# Patient Record
Sex: Female | Born: 1975 | Race: Black or African American | Hispanic: No | Marital: Married | Smoking: Former smoker
Health system: Southern US, Community
[De-identification: ages and names within clinical notes are randomized; demographics above are authoritative.]

## PROBLEM LIST (undated history)

## (undated) DIAGNOSIS — R569 Unspecified convulsions: Secondary | ICD-10-CM

## (undated) HISTORY — PX: FOOT SURGERY: SHX648

---

## 2004-05-29 ENCOUNTER — Emergency Department (HOSPITAL_COMMUNITY): Admission: EM | Admit: 2004-05-29 | Discharge: 2004-05-29 | Payer: Self-pay | Admitting: Family Medicine

## 2004-06-03 ENCOUNTER — Emergency Department (HOSPITAL_COMMUNITY): Admission: EM | Admit: 2004-06-03 | Discharge: 2004-06-03 | Payer: Self-pay | Admitting: Internal Medicine

## 2008-01-06 ENCOUNTER — Ambulatory Visit (HOSPITAL_BASED_OUTPATIENT_CLINIC_OR_DEPARTMENT_OTHER): Admission: RE | Admit: 2008-01-06 | Discharge: 2008-01-06 | Payer: Self-pay | Admitting: Internal Medicine

## 2009-01-06 ENCOUNTER — Emergency Department (HOSPITAL_COMMUNITY): Admission: EM | Admit: 2009-01-06 | Discharge: 2009-01-06 | Payer: Self-pay | Admitting: Emergency Medicine

## 2011-02-27 NOTE — Procedures (Signed)
NAME:  Krista Crosby, SOUTHERS NO.:  [id number]   MEDICAL RECORD NO.:  [id number]          PATIENT TYPE:  OUT   LOCATION:  SLEEP CENTER                 FACILITY:  [facility]   PHYSICIAN:  Clinton D. Maple Hudson, MD, FCCP, FACPDATE OF BIRTH:  12/09/1975   DATE OF STUDY:  01/06/2008                            NOCTURNAL POLYSOMNOGRAM   REFERRING PHYSICIAN:   REFERRING PHYSICIAN:  Fleet Contras, M.D.   INDICATION FOR STUDY:  Hypersomnia with sleep apnea.   EPWORTH SLEEPINESS SCORE:  7/24.   BMI 29.  Weight 185 pounds.  Height 67 inches.  Neck 13.5 inches.   MEDICATIONS:  No home medications were listed.   SLEEP ARCHITECTURE:  Total sleep time 266 minutes with sleep efficiency  73.1%.  Stage I was 7.9%.  Stage II 82.3%.  Stage III absent.  REM 9.8%  of total sleep time.  Sleep latency 79.5 minutes.  REM latency 122  minutes.  Awake after sleep onset 17 minutes.  Arousal index 4.3.  No  bedtime medication taken.   RESPIRATORY DATA:  Apnea-hypopnea index (AHI) 2.3 per hour, which is  within normal limits.  Ten events were scored, including 4 obstructive  apneas and 6 hypopneas.  Most events occurred while not supine.  REM/AHI  11.5.   OXYGEN DATA:  Moderate snoring with oxygen desaturation to a nadir of  85%.  Mean oxygen saturation through the study was 94.3% on room air.   CARDIAC DATA:  Sinus rhythm with occasional PVC.   MOVEMENT-PARASOMNIA:  No significant movement disturbance.  Bathroom x1.   IMPRESSIONS-RECOMMENDATIONS:  1. Occasional respiratory event, within normal limits.  Apnea-hypopnea      index 2.3 per hour (normal range 0-5 per hour).  Events were more      common while not supine.  Moderate snoring with oxygen desaturation      to a nadir of 85%.  2. Sleep onset was delayed until midnight.  Sleep architecture was      otherwise unremarkable.  Suggest correlation with sleep habits at      home to insure appropriate sleep environment and schedule.      Clinton D. Maple Hudson, MD, [facility], FACP  Diplomate, Biomedical engineer of Sleep Medicine  Electronically Signed     CDY/MEDQ  D:  01/10/2008 10:48:33  T:  01/10/2008 12:19:14  Job:  [id number]

## 2015-02-07 ENCOUNTER — Emergency Department (HOSPITAL_COMMUNITY)
Admission: EM | Admit: 2015-02-07 | Discharge: 2015-02-07 | Disposition: A | Discharge status: Home / Self Care | Payer: Medicaid Other | Attending: Emergency Medicine | Admitting: Emergency Medicine

## 2015-02-07 ENCOUNTER — Encounter (HOSPITAL_COMMUNITY): Payer: Self-pay | Admitting: Emergency Medicine

## 2015-02-07 DIAGNOSIS — K0889 Other specified disorders of teeth and supporting structures: Secondary | ICD-10-CM

## 2015-02-07 DIAGNOSIS — K0381 Cracked tooth: Secondary | ICD-10-CM | POA: Diagnosis not present

## 2015-02-07 DIAGNOSIS — K088 Other specified disorders of teeth and supporting structures: Secondary | ICD-10-CM | POA: Diagnosis not present

## 2015-02-07 DIAGNOSIS — K029 Dental caries, unspecified: Secondary | ICD-10-CM | POA: Diagnosis not present

## 2015-02-07 MED ORDER — PENICILLIN V POTASSIUM 250 MG PO TABS: 250.0000 mg | ORAL_TABLET | Freq: Four times a day (QID) | ORAL | Status: AC

## 2015-02-07 NOTE — ED Notes (Signed)
Pain in r/upper mouth x 1 week

## 2015-02-07 NOTE — Discharge Instructions (Signed)

## 2015-02-07 NOTE — ED Provider Notes (Signed)
CSN: [id number]     Arrival date & time 02/07/15  1658 History  This chart was scribed for non-physician practitioner, Teressa LowerVrinda Aleesia Henney, NP, working with Pricilla LovelessScott Goldston, MD, by Lionel DecemberHatice Demirci, ED Scribe. This patient was seen in room WTR7/WTR7 and the patient's care was started at 5:07 PM.  (Consider location/radiation/quality/duration/timing/severity/associated sxs/prior Treatment) The history is provided by the patient. No language interpreter was used.    HPI Comments: Krista Crosby is a 39 y.o. female who presents to the Emergency Department complaining of right tooth pain onset a week ago.  Patient denies any fever/chills.  She does not use daily medication.  She has no other complaints today.    No past medical history on file. No past surgical history on file. No family history on file. History  Substance Use Topics  . Smoking status: Not on file  . Smokeless tobacco: Not on file  . Alcohol Use: Not on file   OB History    No data available     Review of Systems  HENT: Positive for dental problem.   All other systems reviewed and are negative.     Allergies  Review of patient's allergies indicates not on file.  Home Medications   Prior to Admission medications   Not on File   There were no vitals taken for this visit. Physical Exam  Constitutional: She is oriented to person, place, and time. She appears well-developed and well-nourished. No distress.  HENT:  Head: Normocephalic and atraumatic.  Right Ear: External ear normal.  Left Ear: External ear normal.  Mouth/Throat:    No swelling  Eyes: Conjunctivae and EOM are normal.  Neck: Neck supple.  Cardiovascular: Normal rate.   Pulmonary/Chest: Effort normal. No respiratory distress.  Musculoskeletal: Normal range of motion.  Neurological: She is alert and oriented to person, place, and time.  Skin: Skin is warm and dry.  Psychiatric: She has a normal mood and affect. Her behavior is normal.  Nursing  note and vitals reviewed.   ED Course  Procedures (including critical care time)  COORDINATION OF CARE: 5:09 PM Discussed treatment plan with patient at beside, the patient agrees with the plan and has no further questions at this time.   Labs Review Labs Reviewed - No data to display  Imaging Review No results found.   EKG Interpretation None      MDM   Final diagnoses:  Toothache   Pt given pcn for toothache and given dental referral  I personally performed the services described in this documentation, which was scribed in my presence. The recorded information has been reviewed and is accurate.    Teressa LowerVrinda Wylie Russon, NP 02/07/15 1727  Pricilla LovelessScott Goldston, MD 02/08/15 [phone]

## 2016-03-31 ENCOUNTER — Ambulatory Visit (HOSPITAL_COMMUNITY)
Admission: EM | Admit: 2016-03-31 | Discharge: 2016-03-31 | Disposition: A | Discharge status: Home / Self Care | Payer: Medicaid Other | Attending: Emergency Medicine | Admitting: Emergency Medicine

## 2016-03-31 ENCOUNTER — Encounter (HOSPITAL_COMMUNITY): Payer: Self-pay | Admitting: Emergency Medicine

## 2016-03-31 DIAGNOSIS — J069 Acute upper respiratory infection, unspecified: Secondary | ICD-10-CM | POA: Insufficient documentation

## 2016-03-31 DIAGNOSIS — F17200 Nicotine dependence, unspecified, uncomplicated: Secondary | ICD-10-CM | POA: Diagnosis not present

## 2016-03-31 DIAGNOSIS — J0140 Acute pansinusitis, unspecified: Secondary | ICD-10-CM

## 2016-03-31 LAB — POCT RAPID STREP A: Streptococcus, Group A Screen (Direct): NEGATIVE

## 2016-03-31 MED ORDER — AMOXICILLIN-POT CLAVULANATE 875-125 MG PO TABS: 1.0000 | ORAL_TABLET | Freq: Two times a day (BID) | ORAL | Status: DC

## 2016-03-31 MED ORDER — PSEUDOEPHEDRINE-GUAIFENESIN ER 120-1200 MG PO TB12: 1.0000 | ORAL_TABLET | Freq: Two times a day (BID) | ORAL | Status: DC | PRN

## 2016-03-31 MED ORDER — GUAIFENESIN-CODEINE 100-10 MG/5ML PO SYRP: 10.0000 mL | ORAL_SOLUTION | Freq: Four times a day (QID) | ORAL | Status: DC | PRN

## 2016-03-31 MED ORDER — IPRATROPIUM BROMIDE 0.06 % NA SOLN: 2.0000 | Freq: Four times a day (QID) | NASAL | Status: DC

## 2016-03-31 MED ORDER — AEROCHAMBER PLUS MISC: Status: DC

## 2016-03-31 MED ORDER — ALBUTEROL SULFATE HFA 108 (90 BASE) MCG/ACT IN AERS: 1.0000 | INHALATION_SPRAY | Freq: Four times a day (QID) | RESPIRATORY_TRACT | Status: DC | PRN

## 2016-03-31 NOTE — Discharge Instructions (Signed)
Take the medication as written. You may take 800 mg of motrin with 1 gram of tylenol up to 3 times a day as needed for pain. This is an effective combination for pain.  Most sinus infections are viral and do not need antibiotics unless you have a high fever, have had this for 10 days, or you get better and then get sick again. Use a neti pot or the NeilMed sinus rinse as often as you want to to reduce nasal congestion. Follow the directions on the box.  ° °Go to www.goodrx.com to look up your medications. This will give you a list of where you can find your prescriptions at the most affordable prices.   °

## 2016-03-31 NOTE — ED Notes (Signed)
Here for cold sx onset yest associated w/runny nose, BA, sneezing, chills, prod cough, ST, fevers, HA... A&O x4... No acute distress.

## 2016-03-31 NOTE — ED Provider Notes (Signed)
HPI  SUBJECTIVE:  Krista Crosby is a 40 y.o. female who presents with nasalGovernor Specking congestion, rhinorrhea, postnasal drip, ear pain, sinus pain/pressure, sore throat for the past 2 days. She reports cough productive of whitish sputum and states that she cannot sleep at night secondary to coughing. She has tried TheraFlu with improvement in her symptoms. There are no aggravating factors. She has not tried anything else for this She denies voice changes, drooling, trismus, wheezing, chest pain, shortness of breath. She reports abdominal soreness secondary to the cough, but no other abdominal pain. She is tolerating by mouth. No fevers. He reports some sneezing, but No itchy, watery eyes, known sick contacts. No neck stiffness, rash, known tick bite. No antipyretics in the past 6-8 hours. Past medical history of asthma, remote history of seizures. No history of seasonal allergies, diabetes, hypertension.  LMP: "Last month" denies possibility of being pregnant, states that we do not need to check. [facility]: [facility].   History reviewed. No pertinent past medical history.  History reviewed. No pertinent past surgical history.  No family history on file.  Social History  Substance Use Topics  . Smoking status: Current Some Day Smoker  . Smokeless tobacco: None  . Alcohol Use: Yes     Comment: occ    No current facility-administered medications for this encounter.  Current outpatient prescriptions:  .  albuterol (PROVENTIL HFA;VENTOLIN HFA) 108 (90 Base) MCG/ACT inhaler, Inhale 1-2 puffs into the lungs every 6 (six) hours as needed for wheezing or shortness of breath., Disp: 1 Inhaler, Rfl: 0 .  amoxicillin-clavulanate (AUGMENTIN) 875-125 MG tablet, Take 1 tablet by mouth 2 (two) times daily. X 7 days, Disp: 14 tablet, Rfl: 0 .  guaiFENesin-codeine (CHERATUSSIN AC) 100-10 MG/5ML syrup, Take 10 mLs by mouth 4 (four) times daily as needed for cough or congestion., Disp: 120 mL, Rfl: 0 .   ipratropium (ATROVENT) 0.06 % nasal spray, Place 2 sprays into both nostrils 4 (four) times daily. 3-4 times/ day, Disp: 15 mL, Rfl: 0 .  Pseudoephedrine-Guaifenesin (MUCINEX D) [phone] MG TB12, Take 1 tablet by mouth 2 (two) times daily as needed (congestion)., Disp: 20 each, Rfl: 0 .  Spacer/Aero-Holding Chambers (AEROCHAMBER PLUS) inhaler, Use as instructed, Disp: 1 each, Rfl: 2  No Known Allergies   ROS  As noted in HPI.   Physical Exam  BP 122/70 mmHg  Pulse 94  Temp(Src) 97.9 F (36.6 C) (Oral)  SpO2 100%  Constitutional: Well developed, well nourished, no acute distress Eyes:  EOMI, conjunctiva normal bilaterally HENT: Normocephalic, atraumatic,mucus membranes moistTMs normal bilaterally. Positive purulent nasal congestion, swollen, erythematous turbinates. Positive frontal and maxillary sinus tenderness. Post postnasal drip, oropharynx otherwise normal. Neck: No cervical lymphadenopathy, no meningismus Respiratory: Normal inspiratory effort, good air movement, lungs clear bilaterally Cardiovascular: Normal rate regular rhythm, no murmurs rubs gallops GI: nondistended skin: No rash, skin intact Musculoskeletal: no deformities Neurologic: Alert & oriented x 3, no focal neuro deficits Psychiatric: Speech and behavior appropriate   ED Course   Medications - No data to display  Orders Placed This Encounter  Procedures  . Culture, group A strep    Standing Status: Standing     Number of Occurrences: 1     Standing Expiration Date:   . POCT rapid strep A [facility]    Standing Status: Standing     Number of Occurrences: 1     Standing Expiration Date:     No results found for this or  any previous visit (from the past 24 hour(s)). No results found.  ED Clinical Impression  URI (upper respiratory infection)  Acute pansinusitis, recurrence not specified   ED Assessment/Plan  Strep negative. Presentation consistent with a sinusitis, most likely viral.  We'll send Home with  Mucinex D, Atrovent nasal spray, saline nasal irrigation and a wait-and-see prescription of Augmentin. Advised her to not fill this at this time. Gave her clear directions on when to fill the antibiotic prescription- if double sickening, fevers above 102 or symptomatic for more than 10 days. We'll also sent home with albuterol with spacer and Cheratussin and she has a history of asthma and persistent cough. Follow up with primary care physician as needed.  Discussed MDM, plan and followup with patient.  Patient agrees with plan.   *This clinic note was created using Dragon dictation software. Therefore, there may be occasional mistakes despite careful proofreading.  ?   Domenick Gong, MD 04/02/16 1029

## 2016-04-04 LAB — CULTURE, GROUP A STREP (THRC)

## 2016-04-09 ENCOUNTER — Telehealth (HOSPITAL_COMMUNITY): Payer: Self-pay | Admitting: Emergency Medicine

## 2016-04-09 NOTE — ED Notes (Signed)
LM w/Rhonda (friend)... Friend reports pt is feeling better... Adv her to have pt to return call if she wants results.  Need to give lab results from recent visit on 6/17 Also let pt know labs can be obtained from MyChart  Per Dr. Piedad ClimesHonig,   Notes Recorded by Charm RingsErin J Honig, MD on 04/04/2016 at 6:20 PM Throat culture is negative. Wait and see Augmentin prescription should be [facility]. Follow up as needed.

## 2016-08-29 ENCOUNTER — Encounter (HOSPITAL_BASED_OUTPATIENT_CLINIC_OR_DEPARTMENT_OTHER): Payer: Self-pay | Admitting: *Deleted

## 2016-08-29 ENCOUNTER — Emergency Department (HOSPITAL_BASED_OUTPATIENT_CLINIC_OR_DEPARTMENT_OTHER)
Admission: EM | Admit: 2016-08-29 | Discharge: 2016-08-29 | Disposition: A | Discharge status: Home / Self Care | Payer: Medicaid Other | Attending: Physician Assistant | Admitting: Physician Assistant

## 2016-08-29 DIAGNOSIS — F17200 Nicotine dependence, unspecified, uncomplicated: Secondary | ICD-10-CM | POA: Diagnosis not present

## 2016-08-29 DIAGNOSIS — Z79899 Other long term (current) drug therapy: Secondary | ICD-10-CM | POA: Insufficient documentation

## 2016-08-29 DIAGNOSIS — B379 Candidiasis, unspecified: Secondary | ICD-10-CM | POA: Diagnosis not present

## 2016-08-29 DIAGNOSIS — R102 Pelvic and perineal pain: Secondary | ICD-10-CM | POA: Diagnosis present

## 2016-08-29 LAB — URINALYSIS, ROUTINE W REFLEX MICROSCOPIC
BILIRUBIN URINE: NEGATIVE
Glucose, UA: NEGATIVE mg/dL
Hgb urine dipstick: NEGATIVE
Ketones, ur: NEGATIVE mg/dL
Nitrite: NEGATIVE
PH: 6 (ref 5.0–8.0)
Protein, ur: NEGATIVE mg/dL
SPECIFIC GRAVITY, URINE: 1.02 (ref 1.005–1.030)

## 2016-08-29 LAB — URINE MICROSCOPIC-ADD ON

## 2016-08-29 LAB — WET PREP, GENITAL
Clue Cells Wet Prep HPF POC: NONE SEEN
Sperm: NONE SEEN
TRICH WET PREP: NONE SEEN

## 2016-08-29 LAB — PREGNANCY, URINE: Preg Test, Ur: NEGATIVE

## 2016-08-29 MED ORDER — FLUCONAZOLE 50 MG PO TABS
150.0000 mg | ORAL_TABLET | Freq: Once | ORAL | Status: AC
  Administered 2016-08-29: 150 mg via ORAL
  Filled 2016-08-29: qty 1

## 2016-08-29 MED ORDER — NYSTATIN 100000 UNIT/GM EX CREA: TOPICAL_CREAM | CUTANEOUS | 0 refills | Status: DC

## 2016-08-29 NOTE — ED Notes (Signed)
ED Provider at bedside. 

## 2016-08-29 NOTE — ED Triage Notes (Addendum)
Pt c/o painful freq urination x 1 week  Was seen at [facility]  last week for same taking pyridium w/o relief

## 2016-08-29 NOTE — Discharge Instructions (Signed)
You were een today and he had a vaginal infection with yeast. We've given you an oral antibiotic. Additionally given yeast cream today to help with yourer symptoms. Please return if you have any concerns.

## 2016-08-29 NOTE — ED Provider Notes (Signed)
[facility] Provider Note   CSN: [id number] Arrival date & time: 08/29/16  1744  By signing my name below, I, Linna Darner, attest that this documentation has been prepared under the direction and in the presence of physician practitioner, Yaire Kreher Randall An, MD. Electronically Signed: Linna Darner, Scribe. 08/29/2016. 6:34 PM.  History   Chief Complaint Chief Complaint  Patient presents with  . Vaginal Pain    The history is provided by the patient. No language interpreter was used.     HPI Comments: Krista Crosby is a 40 y.o. female who presents to the Emergency Department complaining of constant vaginal pain and irritation for the last two weeks. Pt reports her pain is worse with urination or with palpation to her vagina. She reports associated dysuria as well. Pt was seen at [facility] ER on 11[facility]11 for the same; she was prescribed Pyridium and Vagisil with no relief of her symptoms. She notes she did not have a pelvic exam performed at [facility]. She reports she has recently had unprotected sexual intercourse. She denies nausea, vomiting, diarrhea, fever, chills, or any other associated symptoms.  History reviewed. No pertinent past medical history.  There are no active problems to display for this patient.   History reviewed. No pertinent surgical history.  OB History    No data available       Home Medications    Prior to Admission medications   Medication Sig Start Date End Date Taking? Authorizing Provider  phenazopyridine (PYRIDIUM) 100 MG tablet Take 100 mg by mouth 3 (three) times daily as needed for pain.   Yes Historical Provider, MD  albuterol (PROVENTIL HFA;VENTOLIN HFA) 108 (90 Base) MCG/ACT inhaler Inhale 1-2 puffs into the lungs every 6 (six) hours as needed for wheezing or shortness of breath. 03/31/16   Domenick Gong, MD  amoxicillin-clavulanate (AUGMENTIN) 875-125 MG tablet Take 1 tablet by mouth 2 (two)  times daily. X 7 days 03/31/16   Domenick Gong, MD  guaiFENesin-codeine [facility]) 100-10 MG/5ML syrup Take 10 mLs by mouth 4 (four) times daily as needed for cough or congestion. 03/31/16   Domenick Gong, MD  ipratropium (ATROVENT) 0.06 % nasal spray Place 2 sprays into both nostrils 4 (four) times daily. 3-4 times/ day 03/31/16   Domenick Gong, MD  nystatin cream (MYCOSTATIN) Apply to affected area 2 times daily 08/29/16   Deontrae Drinkard Lyn Shalini Mair, MD  Pseudoephedrine-Guaifenesin (MUCINEX D) [phone] MG TB12 Take 1 tablet by mouth 2 (two) times daily as needed (congestion). 03/31/16   Domenick Gong, MD  Spacer/Aero-Holding Chambers (AEROCHAMBER PLUS) inhaler Use as instructed 03/31/16   Domenick Gong, MD    Family History History reviewed. No pertinent family history.  Social History Social History  Substance Use Topics  . Smoking status: Current Some Day Smoker  . Smokeless tobacco: Never Used  . Alcohol use Yes     Comment: occ     Allergies   Patient has no known allergies.   Review of Systems Review of Systems  Constitutional: Negative for chills and fever.  Gastrointestinal: Negative for diarrhea, nausea and vomiting.  Genitourinary: Positive for dysuria and vaginal pain.  All other systems reviewed and are negative.   Physical Exam Updated Vital Signs BP 119/91 (BP Location: Right Arm)   Pulse 89   Temp 98.2 F (36.8 C)   Resp 16   Ht 5\' 7"  (1.702 m)   Wt 200 lb (90.7 kg)   SpO2 100%  BMI 31.32 kg/m   Physical Exam  Constitutional: She is oriented to person, place, and time. She appears well-developed and well-nourished. No distress.  HENT:  Head: Normocephalic and atraumatic.  Eyes: Conjunctivae and EOM are normal.  Neck: Neck supple. No tracheal deviation present.  Cardiovascular: Normal rate.   Pulmonary/Chest: Effort normal. No respiratory distress.  Genitourinary:  Genitourinary Comments: Vaginal exam: scant whitish-yellow vaginal  discharge, no CMT.  Musculoskeletal: Normal range of motion.  Neurological: She is alert and oriented to person, place, and time.  Skin: Skin is warm and dry.  Psychiatric: She has a normal mood and affect. Her behavior is normal.  Nursing note and vitals reviewed.    ED Treatments / Results  Labs (all labs ordered are listed, but only abnormal results are displayed) Labs Reviewed  WET PREP, GENITAL - Abnormal; Notable for the following:       Result Value   Yeast Wet Prep HPF POC PRESENT (*)    WBC, Wet Prep HPF POC MANY (*)    All other components within normal limits  URINALYSIS, ROUTINE W REFLEX MICROSCOPIC (NOT AT [facility]) - Abnormal; Notable for the following:    APPearance CLOUDY (*)    Leukocytes, UA LARGE (*)    All other components within normal limits  URINE MICROSCOPIC-ADD ON - Abnormal; Notable for the following:    Squamous Epithelial / LPF 6-30 (*)    Bacteria, UA FEW (*)    All other components within normal limits  PREGNANCY, URINE  GC/CHLAMYDIA PROBE AMP ([place]) NOT AT [facility]    EKG  EKG Interpretation None       Radiology No results found.  Procedures Procedures (including critical care time)  DIAGNOSTIC STUDIES: Oxygen Saturation is 98% on RA, normal by my interpretation.    COORDINATION OF CARE: 6:41 PM Discussed treatment plan with pt at bedside and pt agreed to plan.  Medications Ordered in ED Medications  fluconazole (DIFLUCAN) tablet 150 mg (150 mg Oral Given 08/29/16 1942)     Initial Impression / Assessment and Plan / ED Course  I have reviewed the triage vital signs and the nursing notes.  Pertinent labs & imaging results that were available during my care of the patient were reviewed by me and considered in my medical decision making (see chart for details).  Clinical Course    Patient is a 40 year old female presenting with vaginal pain. Patient reports that [facility] and did not have a vaginal  exam but was given Pyridium as well as some kind of vaginal cream. Patient does not have a urinary tract infection at that time. Patient reports she still has irritation to her vagina. She reports that she recently switched Dove soaps. Patient reports unprotected sex but has not had any vesicles or lesions.  Gen. exam shows no lesions, normal vaginal mucosa maybe with slight irritation.  +yeast, treatwith diflucan and cream.   Patient is comfortable, ambulatory, and taking PO at time of discharge.  Patient expressed understanding about return precautions.     I personally performed the services described in this documentation, which was scribed in my presence. The recorded information has been reviewed and is accurate.     Final Clinical Impressions(s) / ED Diagnoses   Final diagnoses:  Yeast infection    New Prescriptions Discharge Medication List as of 08/29/2016  7:36 PM    START taking these medications   Details  nystatin cream (MYCOSTATIN) Apply to affected area  2 times daily, Print         Charika Mikelson Randall AnLyn Nelma Phagan, MD 08/29/16 2003

## 2016-08-30 LAB — GC/CHLAMYDIA PROBE AMP (~~LOC~~) NOT AT ARMC
CHLAMYDIA, DNA PROBE: NEGATIVE
NEISSERIA GONORRHEA: NEGATIVE

## 2017-01-15 ENCOUNTER — Emergency Department (HOSPITAL_BASED_OUTPATIENT_CLINIC_OR_DEPARTMENT_OTHER)
Admission: EM | Admit: 2017-01-15 | Discharge: 2017-01-15 | Disposition: A | Discharge status: Home / Self Care | Payer: Medicaid Other | Attending: Emergency Medicine | Admitting: Emergency Medicine

## 2017-01-15 ENCOUNTER — Encounter (HOSPITAL_BASED_OUTPATIENT_CLINIC_OR_DEPARTMENT_OTHER): Payer: Self-pay | Admitting: Emergency Medicine

## 2017-01-15 ENCOUNTER — Emergency Department (HOSPITAL_BASED_OUTPATIENT_CLINIC_OR_DEPARTMENT_OTHER): Payer: Medicaid Other

## 2017-01-15 DIAGNOSIS — Z79899 Other long term (current) drug therapy: Secondary | ICD-10-CM | POA: Insufficient documentation

## 2017-01-15 DIAGNOSIS — R109 Unspecified abdominal pain: Secondary | ICD-10-CM

## 2017-01-15 DIAGNOSIS — R197 Diarrhea, unspecified: Secondary | ICD-10-CM | POA: Insufficient documentation

## 2017-01-15 DIAGNOSIS — R1032 Left lower quadrant pain: Secondary | ICD-10-CM | POA: Diagnosis not present

## 2017-01-15 DIAGNOSIS — F17200 Nicotine dependence, unspecified, uncomplicated: Secondary | ICD-10-CM | POA: Insufficient documentation

## 2017-01-15 HISTORY — DX: Unspecified convulsions: R56.9

## 2017-01-15 LAB — URINALYSIS, ROUTINE W REFLEX MICROSCOPIC
Bilirubin Urine: NEGATIVE
GLUCOSE, UA: NEGATIVE mg/dL
Ketones, ur: NEGATIVE mg/dL
Leukocytes, UA: NEGATIVE
Nitrite: NEGATIVE
PH: 6 (ref 5.0–8.0)
PROTEIN: NEGATIVE mg/dL
Specific Gravity, Urine: 1.003 — ABNORMAL LOW (ref 1.005–1.030)

## 2017-01-15 LAB — URINALYSIS, MICROSCOPIC (REFLEX)

## 2017-01-15 LAB — PREGNANCY, URINE: Preg Test, Ur: NEGATIVE

## 2017-01-15 LAB — CBG MONITORING, ED: GLUCOSE-CAPILLARY: 78 mg/dL (ref 65–99)

## 2017-01-15 MED ORDER — KETOROLAC TROMETHAMINE 30 MG/ML IJ SOLN
30.0000 mg | Freq: Once | INTRAMUSCULAR | Status: AC
  Administered 2017-01-15: 30 mg via INTRAVENOUS
  Filled 2017-01-15: qty 1

## 2017-01-15 MED ORDER — MORPHINE SULFATE (PF) 4 MG/ML IV SOLN
4.0000 mg | Freq: Once | INTRAVENOUS | Status: AC
  Administered 2017-01-15: 4 mg via INTRAVENOUS
  Filled 2017-01-15: qty 1

## 2017-01-15 NOTE — ED Provider Notes (Signed)
[facility] Provider Note   CSN: [id number] Arrival date & time: 01/15/17  1045     History   Chief Complaint Chief Complaint  Patient presents with  . Flank Pain    HPI Krista Crosby is a 41 y.o. female.  HPI Patient is a 41 year old female presents emergency department with severe left flank pain and left lower abdominal pain intermittently over the past 3 weeks with urinary frequency.  No history of kidney stones.  Denies nausea vomiting.  Reports some occasional diarrhea.  No fevers or chills.  No other significant complaints.   Past Medical History:  Diagnosis Date  . Seizures (HCC)     There are no active problems to display for this patient.   Past Surgical History:  Procedure Laterality Date  . FOOT SURGERY      OB History    No data available       Home Medications    Prior to Admission medications   Medication Sig Start Date End Date Taking? Authorizing Provider  albuterol (PROVENTIL HFA;VENTOLIN HFA) 108 (90 Base) MCG/ACT inhaler Inhale 1-2 puffs into the lungs every 6 (six) hours as needed for wheezing or shortness of breath. 03/31/16   Domenick Gong, MD  amoxicillin-clavulanate (AUGMENTIN) 875-125 MG tablet Take 1 tablet by mouth 2 (two) times daily. X 7 days 03/31/16   Domenick Gong, MD  guaiFENesin-codeine [facility]) 100-10 MG/5ML syrup Take 10 mLs by mouth 4 (four) times daily as needed for cough or congestion. 03/31/16   Domenick Gong, MD  ipratropium (ATROVENT) 0.06 % nasal spray Place 2 sprays into both nostrils 4 (four) times daily. 3-4 times/ day 03/31/16   Domenick Gong, MD  nystatin cream (MYCOSTATIN) Apply to affected area 2 times daily 08/29/16   Courteney Lyn Mackuen, MD  phenazopyridine (PYRIDIUM) 100 MG tablet Take 100 mg by mouth 3 (three) times daily as needed for pain.    Historical Provider, MD  Pseudoephedrine-Guaifenesin (MUCINEX D) [phone] MG TB12 Take 1 tablet by mouth 2 (two) times daily as needed  (congestion). 03/31/16   Domenick Gong, MD  Spacer/Aero-Holding Chambers (AEROCHAMBER PLUS) inhaler Use as instructed 03/31/16   Domenick Gong, MD    Family History History reviewed. No pertinent family history.  Social History Social History  Substance Use Topics  . Smoking status: Current Some Day Smoker  . Smokeless tobacco: Never Used  . Alcohol use Yes     Comment: occ     Allergies   Patient has no known allergies.   Review of Systems Review of Systems  All other systems reviewed and are negative.    Physical Exam Updated Vital Signs BP 101/69 (BP Location: Right Arm)   Pulse 60   Temp 98.4 F (36.9 C) (Oral)   Resp 16   Ht  (1.651 m)   Wt 200 lb (90.7 kg)   SpO2 99%   BMI 33.28 kg/m   Physical Exam  Constitutional: She is oriented to person, place, and time. She appears well-developed and well-nourished. No distress.  HENT:  Head: Normocephalic and atraumatic.  Eyes: EOM are normal.  Neck: Normal range of motion.  Cardiovascular: Normal rate, regular rhythm and normal heart sounds.   Pulmonary/Chest: Effort normal and breath sounds normal.  Abdominal: Soft. She exhibits no distension. There is no tenderness.  Musculoskeletal: Normal range of motion.  Neurological: She is alert and oriented to person, place, and time.  Skin: Skin is warm and dry.  Psychiatric: She has a  normal mood and affect. Judgment normal.  Nursing note and vitals reviewed.    ED Treatments / Results  Labs (all labs ordered are listed, but only abnormal results are displayed) Labs Reviewed  URINALYSIS, ROUTINE W REFLEX MICROSCOPIC - Abnormal; Notable for the following:       Result Value   Specific Gravity, Urine 1.003 (*)    Hgb urine dipstick LARGE (*)    All other components within normal limits  URINALYSIS, MICROSCOPIC (REFLEX) - Abnormal; Notable for the following:    Bacteria, UA RARE (*)    Squamous Epithelial / LPF 0-5 (*)    All other components within  normal limits  PREGNANCY, URINE  CBG MONITORING, ED    EKG  EKG Interpretation None       Radiology Ct Renal Stone Study  Result Date: 01/15/2017 CLINICAL DATA:  Left flank pain.  Hematuria. EXAM: CT ABDOMEN AND PELVIS WITHOUT CONTRAST TECHNIQUE: Multidetector CT imaging of the abdomen and pelvis was performed following the standard protocol without IV contrast. COMPARISON:  08/17/2016 unenhanced CT abdomen/pelvis. FINDINGS: Lower chest: Mild paraseptal emphysema at the lung bases. No acute abnormality. Hepatobiliary: Normal liver with no liver mass. Normal gallbladder with no radiopaque cholelithiasis. No biliary ductal dilatation. Pancreas: Normal, with no mass or duct dilation. Spleen: Normal size. No mass. Adrenals/Urinary Tract: Normal adrenals. No renal stones. No hydronephrosis. No contour deforming renal mass. Normal caliber ureters, with no ureteral stones. Normal bladder, with no bladder stones or bladder wall thickening. Stomach/Bowel: Grossly normal stomach. Normal caliber small bowel with no small bowel wall thickening. Normal appendix . Normal large bowel with no diverticulosis, large bowel wall thickening or pericolonic fat stranding. Vascular/Lymphatic: Normal caliber abdominal aorta. No pathologically enlarged lymph nodes in the abdomen or pelvis. Reproductive: Grossly normal uterus.  No adnexal mass. Other: No pneumoperitoneum, ascites or focal fluid collection. Musculoskeletal: No aggressive appearing focal osseous lesions. Mild thoracolumbar spondylosis. IMPRESSION: 1. No acute abnormality. No urolithiasis. No evidence of urinary tract obstruction . No evidence of bowel obstruction or acute bowel inflammation. 2. Mild paraseptal emphysema at the lung bases. Electronically Signed   By: Delbert Phenix M.D.   On: 01/15/2017 13:38    Procedures Procedures (including critical care time)  Medications Ordered in ED Medications  morphine 4 MG/ML injection 4 mg (4 mg Intravenous Given  01/15/17 1243)  ketorolac (TORADOL) 30 MG/ML injection 30 mg (30 mg Intravenous Given 01/15/17 1244)     Initial Impression / Assessment and Plan / ED Course  I have reviewed the triage vital signs and the nursing notes.  Pertinent labs & imaging results that were available during my care of the patient were reviewed by me and considered in my medical decision making (see chart for details).     CT scan without acute pathology.  No ureteral stone.  Reports she feels much better at this time.  Discharge home in good condition.  Primary care follow-up.  Patient understands return to the ER for new or worsening symptoms.  Final Clinical Impressions(s) / ED Diagnoses   Final diagnoses:  Left flank pain    New Prescriptions Discharge Medication List as of 01/15/2017  2:35 PM       Azalia Bilis, MD 01/15/17 1529

## 2017-01-15 NOTE — ED Triage Notes (Addendum)
Pt reports left flank and left lower abd pain x3 weeks, dysuria, frequency, and odor to urine.  Occasional diarrhea, denies n/v. Pt also having clear vaginal discharge with odor.  Pt alert and oriented.

## 2017-05-19 ENCOUNTER — Encounter (HOSPITAL_BASED_OUTPATIENT_CLINIC_OR_DEPARTMENT_OTHER): Payer: Self-pay

## 2017-05-19 ENCOUNTER — Emergency Department (HOSPITAL_BASED_OUTPATIENT_CLINIC_OR_DEPARTMENT_OTHER)
Admission: EM | Admit: 2017-05-19 | Discharge: 2017-05-19 | Disposition: A | Discharge status: Home / Self Care | Payer: Medicaid Other | Attending: Emergency Medicine | Admitting: Emergency Medicine

## 2017-05-19 DIAGNOSIS — Z87891 Personal history of nicotine dependence: Secondary | ICD-10-CM | POA: Diagnosis not present

## 2017-05-19 DIAGNOSIS — Z79899 Other long term (current) drug therapy: Secondary | ICD-10-CM | POA: Diagnosis not present

## 2017-05-19 DIAGNOSIS — K625 Hemorrhage of anus and rectum: Secondary | ICD-10-CM | POA: Diagnosis present

## 2017-05-19 DIAGNOSIS — M25512 Pain in left shoulder: Secondary | ICD-10-CM | POA: Diagnosis not present

## 2017-05-19 LAB — CBC WITH DIFFERENTIAL/PLATELET
BASOS PCT: 0 %
Basophils Absolute: 0 10*3/uL (ref 0.0–0.1)
EOS PCT: 3 %
Eosinophils Absolute: 0.2 10*3/uL (ref 0.0–0.7)
HEMATOCRIT: 36.7 % (ref 36.0–46.0)
Hemoglobin: 12.3 g/dL (ref 12.0–15.0)
Lymphocytes Relative: 46 %
Lymphs Abs: 2.5 10*3/uL (ref 0.7–4.0)
MCH: 27.6 pg (ref 26.0–34.0)
MCHC: 33.5 g/dL (ref 30.0–36.0)
MCV: 82.5 fL (ref 78.0–100.0)
MONOS PCT: 13 %
Monocytes Absolute: 0.7 10*3/uL (ref 0.1–1.0)
NEUTROS ABS: 2.1 10*3/uL (ref 1.7–7.7)
Neutrophils Relative %: 38 %
PLATELETS: 291 10*3/uL (ref 150–400)
RBC: 4.45 MIL/uL (ref 3.87–5.11)
RDW: 13.2 % (ref 11.5–15.5)
WBC: 5.5 10*3/uL (ref 4.0–10.5)

## 2017-05-19 LAB — BASIC METABOLIC PANEL
ANION GAP: 7 (ref 5–15)
BUN: 12 mg/dL (ref 6–20)
CALCIUM: 9 mg/dL (ref 8.9–10.3)
CO2: 24 mmol/L (ref 22–32)
Chloride: 109 mmol/L (ref 101–111)
Creatinine, Ser: 0.84 mg/dL (ref 0.44–1.00)
GFR calc Af Amer: >60 mL/min (ref >60)
GFR calc non Af Amer: >60 mL/min (ref >60)
Glucose, Bld: 88 mg/dL (ref 65–99)
POTASSIUM: 3.4 mmol/L — AB (ref 3.5–5.1)
SODIUM: 140 mmol/L (ref 135–145)

## 2017-05-19 LAB — OCCULT BLOOD X 1 CARD TO LAB, STOOL: FECAL OCCULT BLD: POSITIVE — AB

## 2017-05-19 NOTE — ED Provider Notes (Signed)
[facility]   CSN: [id number] Arrival date & time: 05/19/17  [id number]     History   Chief Complaint Chief Complaint  Patient presents with  . Rectal Bleeding  . Shoulder Pain    Left    HPI Governor SpeckingLatanyia Y Crosby is a 41 y.o. female.  Patient is a 41 year old female who presents with complaints of rectal bleeding. She states that 2 weeks ago she began with passing bright red blood with her bowel movements. She reports normal stool that is coated in blood. She also reports having blood dripping into the toilet. She denies any abdominal or rectal pain. She denies any fevers or chills. She denies any history of hemorrhoids or rectal fissures. She was seen by her primary doctor who referred her to a specialist, however she canceled that appointment because the bleeding stopped. Resumed this morning and she presents for evaluation of this. She denies any lightheadedness, dizziness, chest pain, or shortness of breath.  She is also complaining of left shoulder pain. She feels that this began at work lifting a heavy object. She has pain to the front of the left shoulder that is worse when she moves her arm. She denies any numbness or tingling. She denies any weakness.   The history is provided by the patient.  Rectal Bleeding  Quality:  Bright red Amount:  Moderate Timing:  Intermittent Chronicity:  New Context: defecation   Context: not anal fissures, not anal penetration, not constipation and not diarrhea   Relieved by:  Nothing Worsened by:  Nothing Ineffective treatments:  None tried Associated symptoms: no abdominal pain and no fever   Risk factors: no anticoagulant use   Shoulder Pain      Past Medical History:  Diagnosis Date  . Seizures (HCC)     There are no active problems to display for this patient.   Past Surgical History:  Procedure Laterality Date  . FOOT SURGERY      OB History    No data available       Home Medications    Prior  to Admission medications   Medication Sig Start Date End Date Taking? Authorizing Provider  albuterol (PROVENTIL HFA;VENTOLIN HFA) 108 (90 Base) MCG/ACT inhaler Inhale 1-2 puffs into the lungs every 6 (six) hours as needed for wheezing or shortness of breath. 03/31/16   Domenick GongMortenson, Ashley, MD  amoxicillin-clavulanate (AUGMENTIN) 875-125 MG tablet Take 1 tablet by mouth 2 (two) times daily. X 7 days 03/31/16   Domenick GongMortenson, Ashley, MD  guaiFENesin-codeine [facility](CHERATUSSIN AC) 100-10 MG/5ML syrup Take 10 mLs by mouth 4 (four) times daily as needed for cough or congestion. 03/31/16   Domenick GongMortenson, Ashley, MD  ipratropium (ATROVENT) 0.06 % nasal spray Place 2 sprays into both nostrils 4 (four) times daily. 3-4 times/ day 03/31/16   Domenick GongMortenson, Ashley, MD  nystatin cream (MYCOSTATIN) Apply to affected area 2 times daily 08/29/16   Mackuen, Courteney Lyn, MD  phenazopyridine (PYRIDIUM) 100 MG tablet Take 100 mg by mouth 3 (three) times daily as needed for pain.    [provider]  Pseudoephedrine-Guaifenesin (MUCINEX D) [phone] MG TB12 Take 1 tablet by mouth 2 (two) times daily as needed (congestion). 03/31/16   Domenick GongMortenson, Ashley, MD  Spacer/Aero-Holding Chambers (AEROCHAMBER PLUS) inhaler Use as instructed 03/31/16   Domenick GongMortenson, Ashley, MD    Family History No family history on file.  Social History Social History  Substance Use Topics  . Smoking status: Former Games developermoker  . Smokeless tobacco: Never Used  .  Alcohol use No     Comment: occ     Allergies   Patient has no known allergies.   Review of Systems Review of Systems  Constitutional: Negative for fever.  Gastrointestinal: Positive for hematochezia. Negative for abdominal pain.  All other systems reviewed and are negative.    Physical Exam Updated Vital Signs BP 124/86 (BP Location: Left Arm)   Pulse 82   Temp 98.6 F (37 C) (Oral)   Resp 18   Ht 5\' 7"  (1.702 m)   Wt 90.7 kg (200 lb)   SpO2 96%   BMI 31.32 kg/m   Physical Exam    Constitutional: She is oriented to person, place, and time. She appears well-developed and well-nourished. No distress.  HENT:  Head: Normocephalic and atraumatic.  Neck: Normal range of motion. Neck supple.  Cardiovascular: Normal rate and regular rhythm.  Exam reveals no gallop and no friction rub.   No murmur heard. Pulmonary/Chest: Effort normal and breath sounds normal. No respiratory distress. She has no wheezes.  Abdominal: Soft. Bowel sounds are normal. She exhibits no distension. There is no tenderness.  Musculoskeletal: Normal range of motion.  The left shoulder appears grossly normal. It is tender to palpation to the anterior deltoid. She has good range of motion with no crepitus. Distal ulnar and radial pulses are easily palpable. She is able to flex, extend, and oppose all fingers. Sensation is intact to the entire hand.  Neurological: She is alert and oriented to person, place, and time.  Skin: Skin is warm and dry. She is not diaphoretic.  Nursing note and vitals reviewed.    ED Treatments / Results  Labs (all labs ordered are listed, but only abnormal results are displayed) Labs Reviewed - No data to display  EKG  EKG Interpretation None       Radiology No results found.  Procedures Procedures (including critical care time)  Medications Ordered in ED Medications - No data to display   Initial Impression / Assessment and Plan / ED Course  I have reviewed the triage vital signs and the nursing notes.  Pertinent labs & imaging results that were available during my care of the patient were reviewed by me and considered in my medical decision making (see chart for details).  Patient presenting here with complaints of rectal bleeding. This is been occurring intermittently for the past 2 weeks. She has no white count and hemoglobin is normal. Rectal examination reveals no obvious hemorrhoids or lesions.  I suspect either an internal hemorrhoid or possibly a  small fissure or tear. I will recommend fiber supplementation and follow-up with gastroenterology.  Final Clinical Impressions(s) / ED Diagnoses   Final diagnoses:  None    New Prescriptions New Prescriptions   No medications on file     Geoffery Lyonselo, Dallis Darden, MD 05/19/17 [phone]

## 2017-05-19 NOTE — Discharge Instructions (Signed)
Metamucil: 1 heaping teaspoon in a glass of water 3 times daily.  Follow-up with gastroenterology if your symptoms are not improving in the next week. The contact information for [facility] has been provided in this discharge summary for you to call and make these arrangements.  Return to the emergency department if you develop worsening bleeding, lightheadedness, dizziness, severe abdominal or rectal pain, or other new and concerning symptoms.

## 2017-05-19 NOTE — ED Notes (Signed)
ED Provider at bedside. 

## 2017-05-19 NOTE — ED Triage Notes (Signed)
Blood in stool for about 3 week. Shoulder pain has been on going for 2 weeks

## 2018-02-28 ENCOUNTER — Emergency Department (HOSPITAL_BASED_OUTPATIENT_CLINIC_OR_DEPARTMENT_OTHER)
Admission: EM | Admit: 2018-02-28 | Discharge: 2018-02-28 | Disposition: A | Discharge status: Home / Self Care | Payer: Medicaid Other | Attending: Emergency Medicine | Admitting: Emergency Medicine

## 2018-02-28 ENCOUNTER — Other Ambulatory Visit: Payer: Self-pay

## 2018-02-28 ENCOUNTER — Encounter (HOSPITAL_BASED_OUTPATIENT_CLINIC_OR_DEPARTMENT_OTHER): Payer: Self-pay | Admitting: Emergency Medicine

## 2018-02-28 DIAGNOSIS — R300 Dysuria: Secondary | ICD-10-CM | POA: Diagnosis not present

## 2018-02-28 DIAGNOSIS — Z87891 Personal history of nicotine dependence: Secondary | ICD-10-CM | POA: Insufficient documentation

## 2018-02-28 DIAGNOSIS — N898 Other specified noninflammatory disorders of vagina: Secondary | ICD-10-CM | POA: Insufficient documentation

## 2018-02-28 DIAGNOSIS — N72 Inflammatory disease of cervix uteri: Secondary | ICD-10-CM

## 2018-02-28 LAB — URINALYSIS, ROUTINE W REFLEX MICROSCOPIC
Bilirubin Urine: NEGATIVE
GLUCOSE, UA: NEGATIVE mg/dL
Hgb urine dipstick: NEGATIVE
Ketones, ur: NEGATIVE mg/dL
LEUKOCYTES UA: NEGATIVE
Nitrite: NEGATIVE
PH: 5 (ref 5.0–8.0)
PROTEIN: NEGATIVE mg/dL
Specific Gravity, Urine: >1.03 — ABNORMAL HIGH (ref 1.005–1.030)

## 2018-02-28 LAB — WET PREP, GENITAL
CLUE CELLS WET PREP: NONE SEEN
Sperm: NONE SEEN
Trich, Wet Prep: NONE SEEN
Yeast Wet Prep HPF POC: NONE SEEN

## 2018-02-28 LAB — PREGNANCY, URINE: Preg Test, Ur: NEGATIVE

## 2018-02-28 MED ORDER — AZITHROMYCIN 1 G PO PACK
PACK | ORAL | Status: AC
  Administered 2018-02-28: 1 g
  Filled 2018-02-28: qty 1

## 2018-02-28 MED ORDER — AZITHROMYCIN 200 MG/5ML PO SUSR
1000.0000 mg | Freq: Once | ORAL | Status: DC
  Filled 2018-02-28: qty 25

## 2018-02-28 MED ORDER — CEFTRIAXONE SODIUM 250 MG IJ SOLR
250.0000 mg | Freq: Once | INTRAMUSCULAR | Status: AC
  Administered 2018-02-28: 250 mg via INTRAMUSCULAR
  Filled 2018-02-28: qty 250

## 2018-02-28 MED ORDER — ACETAMINOPHEN 500 MG PO TABS
1000.0000 mg | ORAL_TABLET | Freq: Once | ORAL | Status: AC
  Administered 2018-02-28: 1000 mg via ORAL
  Filled 2018-02-28: qty 2

## 2018-02-28 NOTE — ED Provider Notes (Signed)
[facility] EMERGENCY DEPARTMENT Provider Note   CSN: [id number] Arrival date & time: 02/28/18  4098     History   Chief Complaint Chief Complaint  Patient presents with  . Dysuria    HPI Krista Crosby is a 42 y.o. female.  Patient with no significant medical history presents with dysuria since this morning. Patient has not had a urine infection for many years. Patient does have vaginal discharge and new sexual partner. No abdominal pain or back pain. No fevers chills or vomiting.     Past Medical History:  Diagnosis Date  . Seizures (HCC)     There are no active problems to display for this patient.   Past Surgical History:  Procedure Laterality Date  . FOOT SURGERY       OB History   None      Home Medications    Prior to Admission medications   Medication Sig Start Date End Date Taking? Authorizing Provider  albuterol (PROVENTIL HFA;VENTOLIN HFA) 108 (90 Base) MCG/ACT inhaler Inhale 1-2 puffs into the lungs every 6 (six) hours as needed for wheezing or shortness of breath. 03/31/16   Domenick Gong, MD  amoxicillin-clavulanate (AUGMENTIN) 875-125 MG tablet Take 1 tablet by mouth 2 (two) times daily. X 7 days 03/31/16   Domenick Gong, MD  guaiFENesin-codeine [facility]) 100-10 MG/5ML syrup Take 10 mLs by mouth 4 (four) times daily as needed for cough or congestion. 03/31/16   Domenick Gong, MD  ipratropium (ATROVENT) 0.06 % nasal spray Place 2 sprays into both nostrils 4 (four) times daily. 3-4 times/ day 03/31/16   Domenick Gong, MD  nystatin cream (MYCOSTATIN) Apply to affected area 2 times daily 08/29/16   Mackuen, Courteney Lyn, MD  phenazopyridine (PYRIDIUM) 100 MG tablet Take 100 mg by mouth 3 (three) times daily as needed for pain.    [provider]  Pseudoephedrine-Guaifenesin (MUCINEX D) [phone] MG TB12 Take 1 tablet by mouth 2 (two) times daily as needed (congestion). 03/31/16   Domenick Gong, MD    Spacer/Aero-Holding Chambers (AEROCHAMBER PLUS) inhaler Use as instructed 03/31/16   Domenick Gong, MD    Family History No family history on file.  Social History Social History   Tobacco Use  . Smoking status: Former Games developer  . Smokeless tobacco: Never Used  Substance Use Topics  . Alcohol use: No  . Drug use: No     Allergies   Patient has no known allergies.   Review of Systems Review of Systems  Constitutional: Negative for chills and fever.  Gastrointestinal: Negative for abdominal pain and vomiting.  Genitourinary: Positive for dysuria. Negative for flank pain.  Musculoskeletal: Negative for back pain, neck pain and neck stiffness.  Skin: Negative for rash.  Neurological: Negative for light-headedness and headaches.     Physical Exam Updated Vital Signs BP 111/73 (BP Location: Right Arm)   Pulse (!) 102   Temp 98 F (36.7 C) (Oral)   Resp 16   Ht  (1.702 m)   Wt 89.8 kg (198 lb)   LMP 12/25/2017 (Approximate)   SpO2 98%   BMI 31.01 kg/m   Physical Exam  Constitutional: She is oriented to person, place, and time. She appears well-developed and well-nourished.  HENT:  Head: Normocephalic and atraumatic.  Eyes: Conjunctivae are normal. Right eye exhibits no discharge. Left eye exhibits no discharge.  Neck: Normal range of motion. Neck supple. No tracheal deviation present.  Cardiovascular: Normal rate.  Pulmonary/Chest: Effort normal.  Abdominal:  Soft. She exhibits no distension. There is no tenderness. There is no guarding.  Genitourinary:  Genitourinary Comments: Mild discharge, no cervical motion tenderness. General  discomfort  Musculoskeletal: She exhibits no edema.  Neurological: She is alert and oriented to person, place, and time.  Skin: Skin is warm. No rash noted.  Psychiatric: She has a normal mood and affect.  Nursing note and vitals reviewed.    ED Treatments / Results  Labs (all labs ordered are listed, but only abnormal  results are displayed) Labs Reviewed  WET PREP, GENITAL - Abnormal; Notable for the following components:      Result Value   WBC, Wet Prep HPF POC MODERATE (*)    All other components within normal limits  URINALYSIS, ROUTINE W REFLEX MICROSCOPIC - Abnormal; Notable for the following components:   Specific Gravity, Urine >1.030 (*)    All other components within normal limits  PREGNANCY, URINE  GC/CHLAMYDIA PROBE AMP ([place]) NOT AT [facility]    EKG None  Radiology No results found.  Procedures Procedures (including critical care time)  Medications Ordered in ED Medications  azithromycin (ZITHROMAX) 200 MG/5ML suspension 1,000 mg (has no administration in time range)  acetaminophen (TYLENOL) tablet 1,000 mg (1,000 mg Oral Given 02/28/18 1034)  cefTRIAXone (ROCEPHIN) injection 250 mg (250 mg Intramuscular Given 02/28/18 1058)     Initial Impression / Assessment and Plan / ED Course  I have reviewed the triage vital signs and the nursing notes.  Pertinent labs & imaging results that were available during my care of the patient were reviewed by me and considered in my medical decision making (see chart for details).    Patient presents with dysuria, urinalysis overall unremarkable, vaginitis screen unremarkable. Concern for cervicitis. Discussed treatment in the ER and follow up outpatient for culture results.  Results and differential diagnosis were discussed with the patient/parent/guardian. Xrays were independently reviewed by myself.  Close follow up outpatient was discussed, comfortable with the plan.   Medications  azithromycin (ZITHROMAX) 200 MG/5ML suspension 1,000 mg (has no administration in time range)  acetaminophen (TYLENOL) tablet 1,000 mg (1,000 mg Oral Given 02/28/18 1034)  cefTRIAXone (ROCEPHIN) injection 250 mg (250 mg Intramuscular Given 02/28/18 1058)    Vitals:   02/28/18 0929 02/28/18 0931  BP:  111/73  Pulse:  (!) 102  Resp:  16  Temp:  98 F  (36.7 C)  TempSrc:  Oral  SpO2:  98%  Weight: 89.8 kg (198 lb)   Height:  (1.702 m)     Final diagnoses:  Dysuria  Vaginal discharge  Cervicitis     Final Clinical Impressions(s) / ED Diagnoses   Final diagnoses:  Dysuria  Vaginal discharge  Cervicitis    ED Discharge Orders    None       Blane Ohara, MD 02/28/18 1106

## 2018-02-28 NOTE — ED Triage Notes (Signed)
Burning urination that started this morning. Denies abdominal or back pain.  Pt reports swollen labia. Also noticed white vaginal discharge last night.

## 2018-02-28 NOTE — Discharge Instructions (Addendum)
Follow-up with her primary doctor if you do not have one you can call [facility] or another primary care office in your area. Phone number [phone]. Follow-up your culture results from today. Safe sexual practices/

## 2018-03-03 LAB — GC/CHLAMYDIA PROBE AMP (~~LOC~~) NOT AT ARMC
Chlamydia: NEGATIVE
NEISSERIA GONORRHEA: NEGATIVE

## 2018-08-04 IMAGING — CT CT RENAL STONE PROTOCOL
2 of 4 series · 16 of 46 positions shown, 18 images · non-contrast
Comparison: 08/17/2016 unenhanced CT abdomen/pelvis.

CLINICAL DATA: Left flank pain.  Hematuria.

EXAM:
CT ABDOMEN AND PELVIS WITHOUT CONTRAST
TECHNIQUE: Multidetector CT imaging of the abdomen and pelvis was performed
following the standard protocol without IV contrast.

[Series 2: axial st · axial · 0.86mm/px · z∈[-406,+10]mm · 13 of 91 slices shown, 15 images]
[im 4/91  soft-tissue]
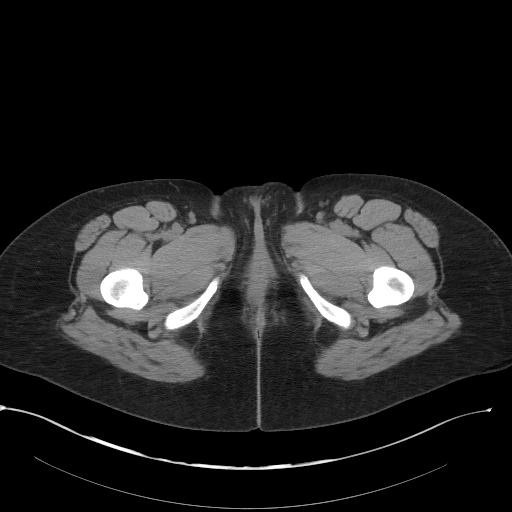
[im 4/91  bone]
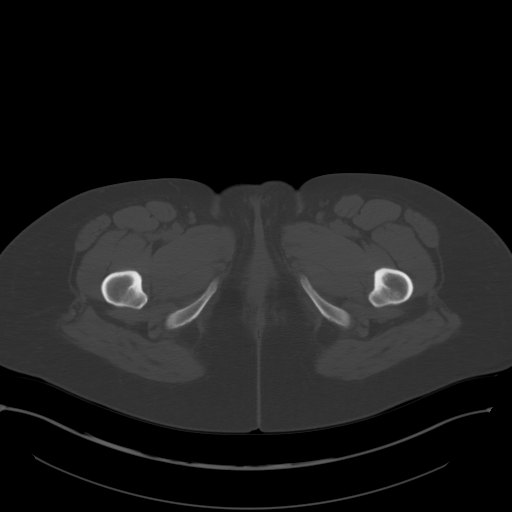
[im 12/91  soft-tissue]
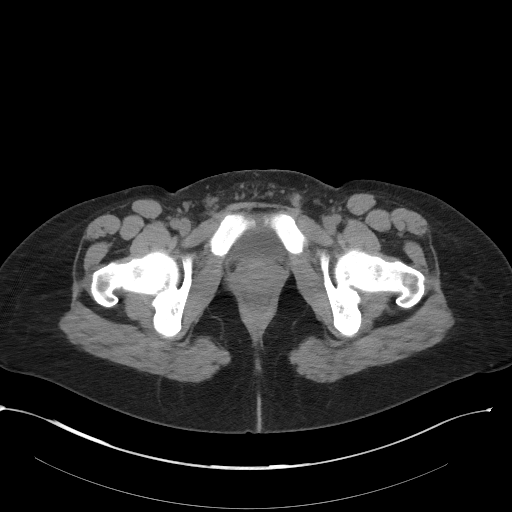
[im 20/91  soft-tissue]
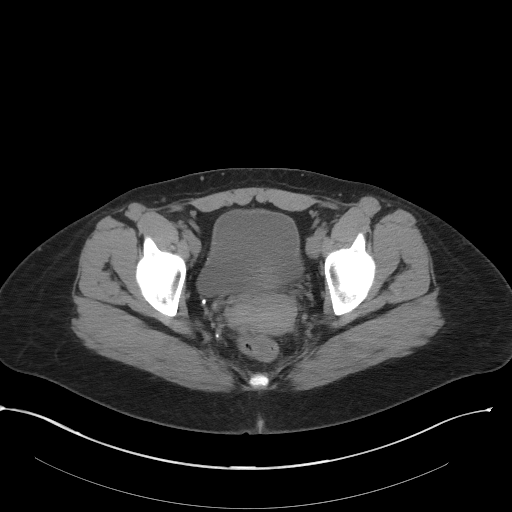
[im 24/91  soft-tissue]
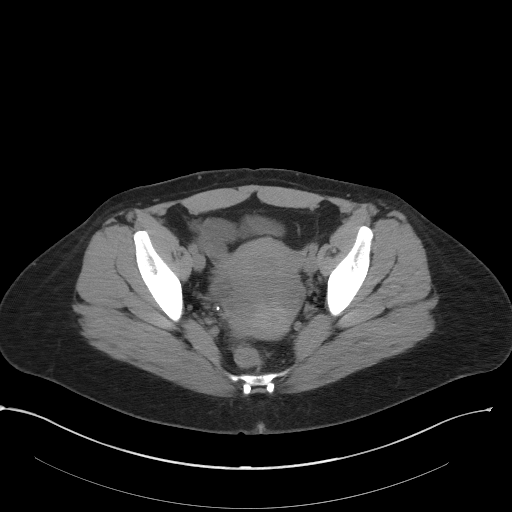
[im 32/91  soft-tissue]
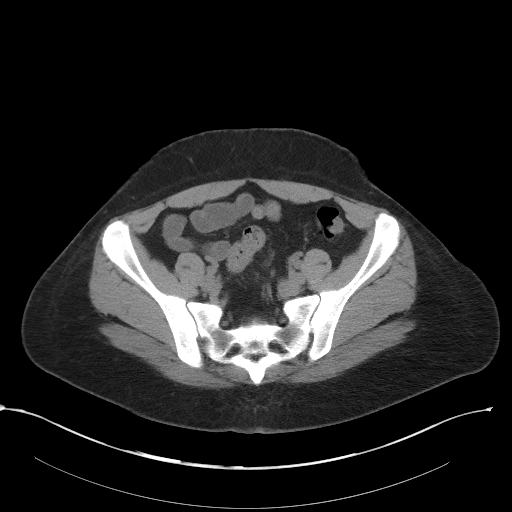
[im 40/91  soft-tissue]
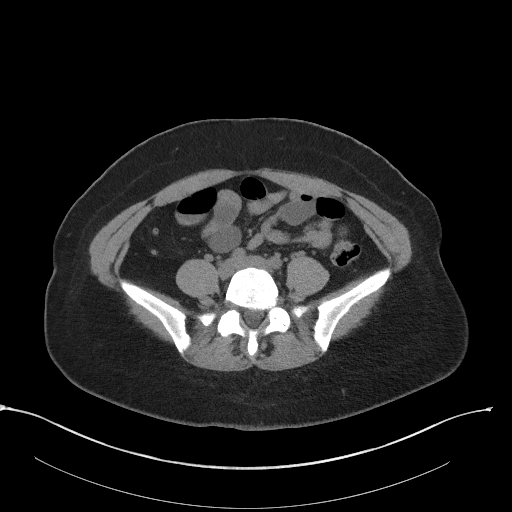
[im 47/91  soft-tissue]
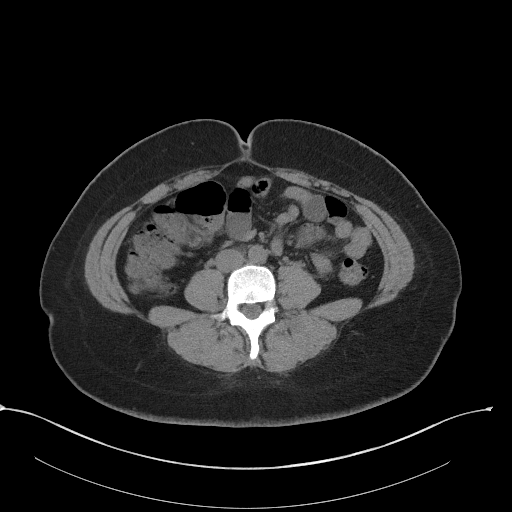
[im 51/91  soft-tissue]
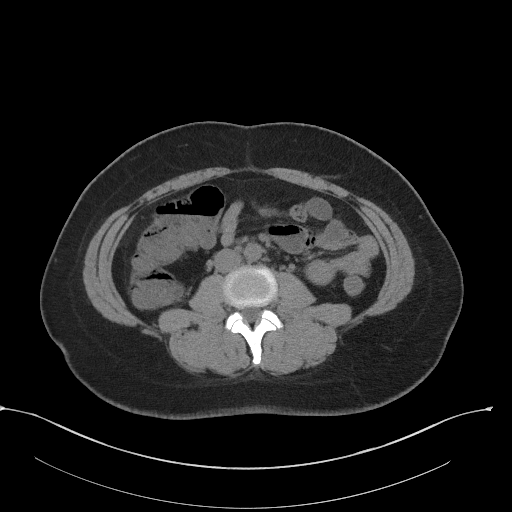
[im 59/91  soft-tissue]
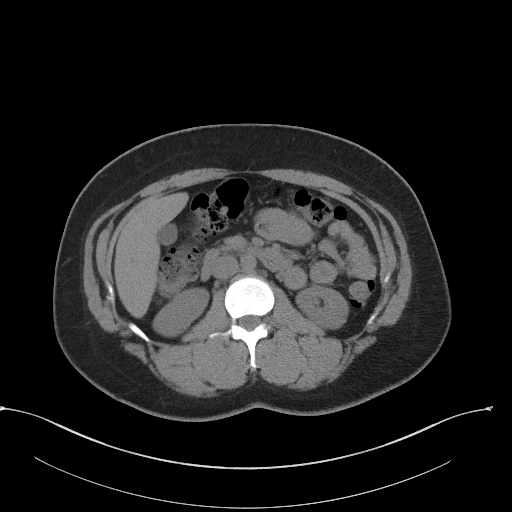
[im 59/91  bone]
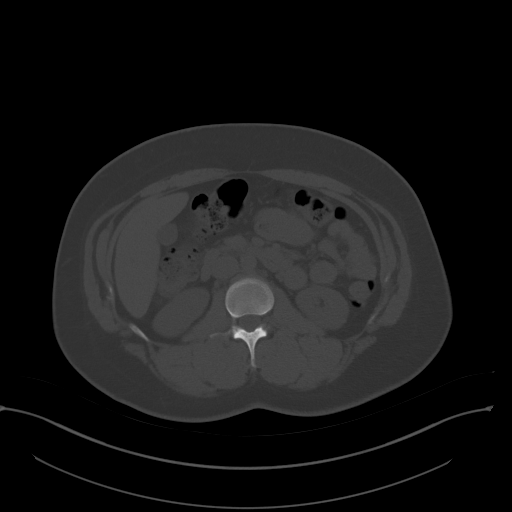
[im 67/91  soft-tissue]
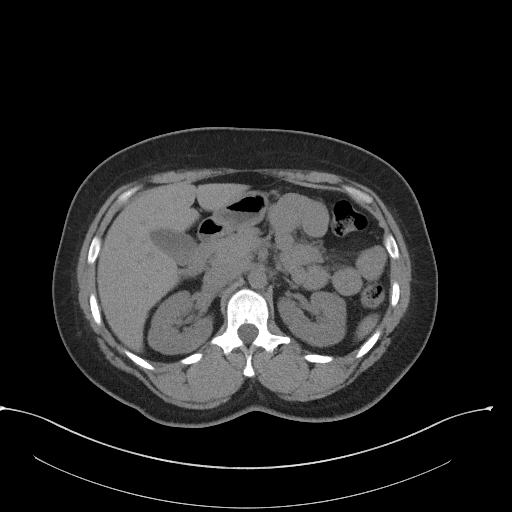
[im 71/91  soft-tissue]
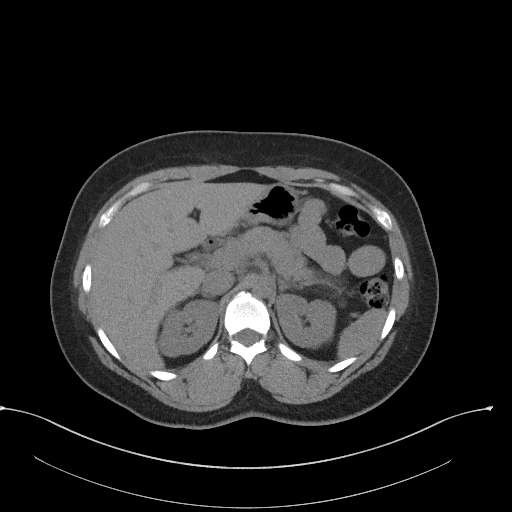
[im 79/91  soft-tissue]
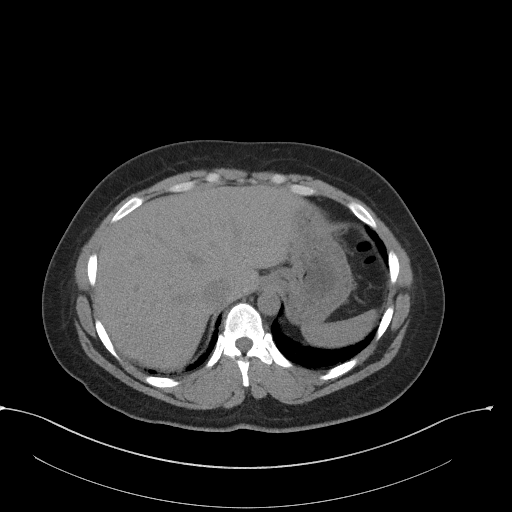
[im 87/91  soft-tissue]
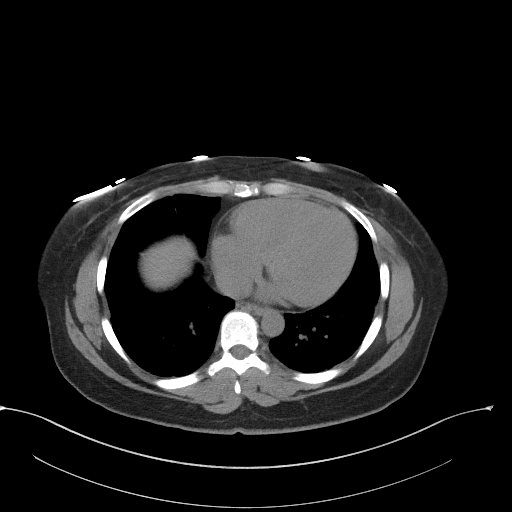

[Series 5: coronal st · coronal · 0.84mm/px · 3 of 94 slices shown]
[im 32/94  soft-tissue]
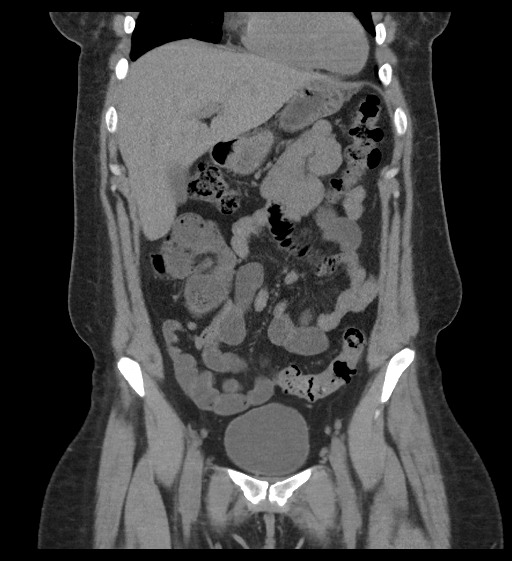
[im 42/94  soft-tissue]
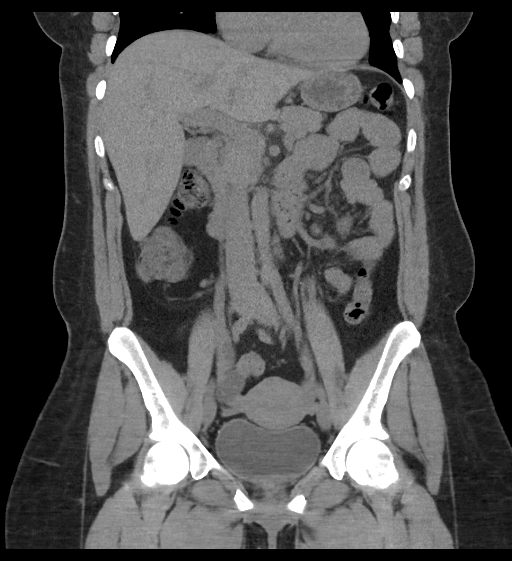
[im 52/94  soft-tissue]
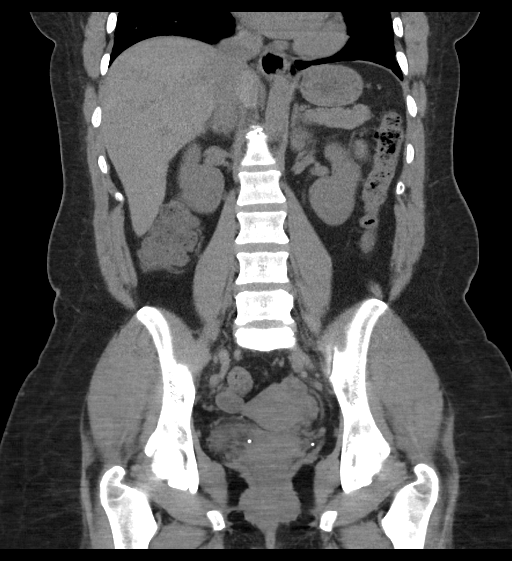

[16 of 46 positions shown; findings below may reference images not displayed]

FINDINGS: Lower chest: Mild paraseptal emphysema at the lung bases. No acute
abnormality.

Hepatobiliary: Normal liver with no liver mass. Normal gallbladder
with no radiopaque cholelithiasis. No biliary ductal dilatation.

Pancreas: Normal, with no mass or duct dilation.

Spleen: Normal size. No mass.

Adrenals/Urinary Tract: Normal adrenals. No renal stones. No
hydronephrosis. No contour deforming renal mass. Normal caliber
ureters, with no ureteral stones. Normal bladder, with no bladder
stones or bladder wall thickening.

Stomach/Bowel: Grossly normal stomach. Normal caliber small bowel
with no small bowel wall thickening. Normal appendix . Normal large
bowel with no diverticulosis, large bowel wall thickening or
pericolonic fat stranding.

Vascular/Lymphatic: Normal caliber abdominal aorta. No
pathologically enlarged lymph nodes in the abdomen or pelvis.

Reproductive: Grossly normal uterus.  No adnexal mass.

Other: No pneumoperitoneum, ascites or focal fluid collection.

Musculoskeletal: No aggressive appearing focal osseous lesions. Mild
thoracolumbar spondylosis.
IMPRESSION: 1. No acute abnormality. No urolithiasis. No evidence of urinary
tract obstruction . No evidence of bowel obstruction or acute bowel
inflammation.
2. Mild paraseptal emphysema at the lung bases.

## 2019-11-11 ENCOUNTER — Emergency Department (HOSPITAL_BASED_OUTPATIENT_CLINIC_OR_DEPARTMENT_OTHER)
Admission: EM | Admit: 2019-11-11 | Discharge: 2019-11-11 | Disposition: A | Discharge status: Home / Self Care | Payer: Medicaid Other | Attending: Emergency Medicine | Admitting: Emergency Medicine

## 2019-11-11 ENCOUNTER — Other Ambulatory Visit: Payer: Self-pay

## 2019-11-11 ENCOUNTER — Emergency Department (HOSPITAL_BASED_OUTPATIENT_CLINIC_OR_DEPARTMENT_OTHER): Payer: Medicaid Other

## 2019-11-11 ENCOUNTER — Encounter (HOSPITAL_BASED_OUTPATIENT_CLINIC_OR_DEPARTMENT_OTHER): Payer: Self-pay | Admitting: *Deleted

## 2019-11-11 DIAGNOSIS — J069 Acute upper respiratory infection, unspecified: Secondary | ICD-10-CM | POA: Diagnosis not present

## 2019-11-11 DIAGNOSIS — R05 Cough: Secondary | ICD-10-CM | POA: Diagnosis present

## 2019-11-11 DIAGNOSIS — Z20822 Contact with and (suspected) exposure to covid-19: Secondary | ICD-10-CM | POA: Insufficient documentation

## 2019-11-11 DIAGNOSIS — Z87891 Personal history of nicotine dependence: Secondary | ICD-10-CM | POA: Insufficient documentation

## 2019-11-11 DIAGNOSIS — Z79899 Other long term (current) drug therapy: Secondary | ICD-10-CM | POA: Diagnosis not present

## 2019-11-11 MED ORDER — IBUPROFEN 800 MG PO TABS
800.0000 mg | ORAL_TABLET | Freq: Once | ORAL | Status: AC
  Administered 2019-11-11: 19:00:00 800 mg via ORAL
  Filled 2019-11-11: qty 1

## 2019-11-11 MED ORDER — PROMETHAZINE-DM 6.25-15 MG/5ML PO SYRP: 5.0000 mL | ORAL_SOLUTION | Freq: Four times a day (QID) | ORAL | 0 refills | Status: DC | PRN

## 2019-11-11 MED ORDER — ACETAMINOPHEN 500 MG PO TABS
1000.0000 mg | ORAL_TABLET | Freq: Once | ORAL | Status: AC
  Administered 2019-11-11: 1000 mg via ORAL
  Filled 2019-11-11: qty 2

## 2019-11-11 MED ORDER — GUAIFENESIN ER 1200 MG PO TB12: 1.0000 | ORAL_TABLET | Freq: Two times a day (BID) | ORAL | 0 refills | Status: DC

## 2019-11-11 NOTE — Discharge Instructions (Addendum)
Return here as needed.  Tylenol 1000 mg every 6 hours.  Increase your fluid intake and rest as much as possible.  Your chest x-ray does not show any signs of pneumonia.

## 2019-11-11 NOTE — ED Provider Notes (Signed)
[facility] EMERGENCY DEPARTMENT Provider Note   CSN: [id number] Arrival date & time: 11/11/19  1643     History Chief Complaint  Patient presents with  . Cough    Krista Crosby is a 44 y.o. female.  HPI Patient presents to the emergency department with nasal congestion and drainage along with headache over the last 3 days.  The patient is also had some nausea.  Patient is complained of coughing due to the nasal congestion and drainage.  Patient states that she is taken over-the-counter medications for her symptoms.  Patient states that nothing seems to make her condition better or worse.  Patient states that she has had no vomiting or diarrhea.  The patient denies chest pain, shortness of breath, headache,blurred vision, neck pain, fever,weakness, numbness, dizziness, anorexia, edema, abdominal pain, nausea, vomiting, diarrhea, rash, back pain, dysuria, hematemesis, bloody stool, near syncope, or syncope.    Past Medical History:  Diagnosis Date  . Seizures (HCC)     There are no problems to display for this patient.   Past Surgical History:  Procedure Laterality Date  . FOOT SURGERY       OB History   No obstetric history on file.     No family history on file.  Social History   Tobacco Use  . Smoking status: Former Games developer  . Smokeless tobacco: Never Used  Substance Use Topics  . Alcohol use: No  . Drug use: No    Home Medications Prior to Admission medications   Medication Sig Start Date End Date Taking? Authorizing Provider  albuterol (PROVENTIL HFA;VENTOLIN HFA) 108 (90 Base) MCG/ACT inhaler Inhale 1-2 puffs into the lungs every 6 (six) hours as needed for wheezing or shortness of breath. 03/31/16   Domenick Gong, MD  amoxicillin-clavulanate (AUGMENTIN) 875-125 MG tablet Take 1 tablet by mouth 2 (two) times daily. X 7 days 03/31/16   Domenick Gong, MD  guaiFENesin-codeine [facility]) 100-10 MG/5ML syrup Take 10 mLs by mouth 4 (four)  times daily as needed for cough or congestion. 03/31/16   Domenick Gong, MD  ipratropium (ATROVENT) 0.06 % nasal spray Place 2 sprays into both nostrils 4 (four) times daily. 3-4 times/ day 03/31/16   Domenick Gong, MD  nystatin cream (MYCOSTATIN) Apply to affected area 2 times daily 08/29/16   Mackuen, Courteney Lyn, MD  phenazopyridine (PYRIDIUM) 100 MG tablet Take 100 mg by mouth 3 (three) times daily as needed for pain.    [provider]  Pseudoephedrine-Guaifenesin (MUCINEX D) [phone] MG TB12 Take 1 tablet by mouth 2 (two) times daily as needed (congestion). 03/31/16   Domenick Gong, MD  Spacer/Aero-Holding Chambers (AEROCHAMBER PLUS) inhaler Use as instructed 03/31/16   Domenick Gong, MD    Allergies    Patient has no known allergies.  Review of Systems   Review of Systems All other systems negative except as documented in the HPI. All pertinent positives and negatives as reviewed in the HPI. Physical Exam Updated Vital Signs BP 130/87   Pulse 86   Temp 99.2 F (37.3 C) (Oral)   Resp 17   LMP 11/08/2019   SpO2 100%   Physical Exam Vitals and nursing note reviewed.  Constitutional:      General: She is not in acute distress.    Appearance: She is well-developed.  HENT:     Head: Normocephalic and atraumatic.     Right Ear: Tympanic membrane normal.     Left Ear: Tympanic membrane normal.  Nose: Congestion present.     Mouth/Throat:     Pharynx: Oropharynx is clear.  Eyes:     Pupils: Pupils are equal, round, and reactive to light.  Cardiovascular:     Rate and Rhythm: Normal rate and regular rhythm.     Heart sounds: Normal heart sounds. No murmur. No friction rub. No gallop.   Pulmonary:     Effort: Pulmonary effort is normal. No respiratory distress.     Breath sounds: Normal breath sounds. No wheezing.  Abdominal:     General: Bowel sounds are normal. There is no distension.     Palpations: Abdomen is soft.     Tenderness: There is no  abdominal tenderness.  Musculoskeletal:     Cervical back: Normal range of motion and neck supple.  Skin:    General: Skin is warm and dry.     Capillary Refill: Capillary refill takes less than 2 seconds.     Findings: No erythema or rash.  Neurological:     Mental Status: She is alert and oriented to person, place, and time.     Motor: No abnormal muscle tone.     Coordination: Coordination normal.  Psychiatric:        Behavior: Behavior normal.     ED Results / Procedures / Treatments   Labs (all labs ordered are listed, but only abnormal results are displayed) Labs Reviewed  NOVEL CORONAVIRUS, NAA (HOSP ORDER, SEND-OUT TO REF LAB; TAT 18-24 HRS)    EKG None  Radiology DG Chest Port 1 View  Result Date: 11/11/2019 CLINICAL DATA:  Cough and sinus pressure. EXAM: PORTABLE CHEST 1 VIEW COMPARISON:  None. FINDINGS: The heart size and mediastinal contours are within normal limits. Both lungs are clear. The visualized skeletal structures are unremarkable. IMPRESSION: No active disease. Electronically Signed   By: Virgina Norfolk M.D.   On: 11/11/2019 18:04    Procedures Procedures (including critical care time)  Medications Ordered in ED Medications  acetaminophen (TYLENOL) tablet 1,000 mg (1,000 mg Oral Given 11/11/19 1859)  ibuprofen (ADVIL) tablet 800 mg (800 mg Oral Given 11/11/19 1900)    ED Course  I have reviewed the triage vital signs and the nursing notes.  Pertinent labs & imaging results that were available during my care of the patient were reviewed by me and considered in my medical decision making (see chart for details).    MDM Rules/Calculators/A&P        And will be assumed Covid until her test results are back.  I did advise her that she may have other entities causing this but we will wait for this result to return.  Patient will be asked to quarantine until her results have been confirmed.  Patient is advised to increase her fluid intake and rest as  much as possible.             Patient will be final Clinical Impression(s) / ED Diagnoses Final diagnoses:  None    Rx / DC Orders ED Discharge Orders    None       Dalia Heading, PA-C 11/11/19 1911    Fredia Sorrow, MD 11/21/19 0800

## 2019-11-11 NOTE — ED Triage Notes (Signed)
Pt c/o cough , sinus pressure, h/a nausea x 3 days

## 2019-11-11 NOTE — ED Notes (Signed)
ED Provider at bedside. 

## 2019-11-12 LAB — NOVEL CORONAVIRUS, NAA (HOSP ORDER, SEND-OUT TO REF LAB; TAT 18-24 HRS): SARS-CoV-2, NAA: NOT DETECTED

## 2020-06-04 ENCOUNTER — Other Ambulatory Visit: Payer: Self-pay

## 2020-06-04 ENCOUNTER — Encounter (HOSPITAL_BASED_OUTPATIENT_CLINIC_OR_DEPARTMENT_OTHER): Payer: Self-pay | Admitting: Emergency Medicine

## 2020-06-04 DIAGNOSIS — M545 Low back pain: Secondary | ICD-10-CM | POA: Diagnosis present

## 2020-06-04 DIAGNOSIS — Z7951 Long term (current) use of inhaled steroids: Secondary | ICD-10-CM | POA: Insufficient documentation

## 2020-06-04 DIAGNOSIS — N390 Urinary tract infection, site not specified: Secondary | ICD-10-CM | POA: Insufficient documentation

## 2020-06-04 NOTE — ED Triage Notes (Signed)
Patient states that she is having lower back pain that hurts worse with movement and laying down. The patient reports that she has had the pain x 3 days

## 2020-06-05 ENCOUNTER — Other Ambulatory Visit: Payer: Self-pay

## 2020-06-05 ENCOUNTER — Emergency Department (HOSPITAL_BASED_OUTPATIENT_CLINIC_OR_DEPARTMENT_OTHER)
Admission: EM | Admit: 2020-06-05 | Discharge: 2020-06-06 | Disposition: A | Discharge status: Home / Self Care | Payer: Medicaid Other | Source: Home / Self Care | Attending: Emergency Medicine | Admitting: Emergency Medicine

## 2020-06-05 ENCOUNTER — Emergency Department (HOSPITAL_BASED_OUTPATIENT_CLINIC_OR_DEPARTMENT_OTHER)
Admission: EM | Admit: 2020-06-05 | Discharge: 2020-06-05 | Disposition: A | Discharge status: Home / Self Care | Payer: Medicaid Other | Attending: Emergency Medicine | Admitting: Emergency Medicine

## 2020-06-05 ENCOUNTER — Encounter (HOSPITAL_BASED_OUTPATIENT_CLINIC_OR_DEPARTMENT_OTHER): Payer: Self-pay | Admitting: Emergency Medicine

## 2020-06-05 DIAGNOSIS — M545 Low back pain: Secondary | ICD-10-CM | POA: Insufficient documentation

## 2020-06-05 DIAGNOSIS — Z87891 Personal history of nicotine dependence: Secondary | ICD-10-CM | POA: Insufficient documentation

## 2020-06-05 DIAGNOSIS — N390 Urinary tract infection, site not specified: Secondary | ICD-10-CM | POA: Insufficient documentation

## 2020-06-05 DIAGNOSIS — T148XXA Other injury of unspecified body region, initial encounter: Secondary | ICD-10-CM

## 2020-06-05 DIAGNOSIS — Z7951 Long term (current) use of inhaled steroids: Secondary | ICD-10-CM | POA: Insufficient documentation

## 2020-06-05 NOTE — ED Triage Notes (Signed)
Reports low back pain for a couple of days.  Feels like it may be a UTI.  Also endorses foul smelling urine.

## 2020-06-06 ENCOUNTER — Encounter (HOSPITAL_BASED_OUTPATIENT_CLINIC_OR_DEPARTMENT_OTHER): Payer: Self-pay | Admitting: Emergency Medicine

## 2020-06-06 LAB — URINALYSIS, ROUTINE W REFLEX MICROSCOPIC
Bilirubin Urine: NEGATIVE
Glucose, UA: NEGATIVE mg/dL
Hgb urine dipstick: NEGATIVE
Ketones, ur: NEGATIVE mg/dL
Leukocytes,Ua: NEGATIVE
Nitrite: NEGATIVE
Protein, ur: NEGATIVE mg/dL
Specific Gravity, Urine: >1.03 — ABNORMAL HIGH (ref 1.005–1.030)
pH: 5.5 (ref 5.0–8.0)

## 2020-06-06 LAB — PREGNANCY, URINE: Preg Test, Ur: NEGATIVE

## 2020-06-06 MED ORDER — METHOCARBAMOL 500 MG PO TABS: 500.0000 mg | ORAL_TABLET | Freq: Two times a day (BID) | ORAL | 0 refills | Status: DC

## 2020-06-06 MED ORDER — METHOCARBAMOL 500 MG PO TABS
1000.0000 mg | ORAL_TABLET | Freq: Once | ORAL | Status: AC
Start: 2020-06-06 — End: 2020-06-06
  Administered 2020-06-06: 1000 mg via ORAL
  Filled 2020-06-06: qty 2

## 2020-06-06 MED ORDER — LIDOCAINE 5 % EX PTCH: 1.0000 | MEDICATED_PATCH | CUTANEOUS | 0 refills | Status: DC

## 2020-06-06 MED ORDER — IBUPROFEN 800 MG PO TABS
800.0000 mg | ORAL_TABLET | Freq: Once | ORAL | Status: AC
  Administered 2020-06-06: 800 mg via ORAL
  Filled 2020-06-06: qty 1

## 2020-06-06 MED ORDER — NAPROXEN 375 MG PO TABS: 375.0000 mg | ORAL_TABLET | Freq: Two times a day (BID) | ORAL | 0 refills | Status: DC

## 2020-06-06 MED ORDER — LIDOCAINE 5 % EX PTCH
1.0000 | MEDICATED_PATCH | CUTANEOUS | Status: DC
  Administered 2020-06-06: 1 via TRANSDERMAL
  Filled 2020-06-06: qty 1

## 2020-06-06 MED ORDER — ACETAMINOPHEN 500 MG PO TABS
1000.0000 mg | ORAL_TABLET | Freq: Once | ORAL | Status: AC
  Administered 2020-06-06: 1000 mg via ORAL
  Filled 2020-06-06: qty 2

## 2020-06-06 NOTE — ED Notes (Signed)
EDP aware of patient's continued pain.

## 2020-06-06 NOTE — ED Provider Notes (Signed)
[facility] EMERGENCY DEPARTMENT Provider Note   CSN: [id number] Arrival date & time: 06/05/20  2321     History Chief Complaint  Patient presents with  . Urinary Tract Infection    Krista Crosby is a 44 y.o. female.  The history is provided by the patient.  Back Pain Location:  Sacro-iliac joint Quality:  Aching Radiates to:  Does not radiate Pain severity:  Moderate Pain is:  Same all the time Onset quality:  Gradual Duration:  3 days Timing:  Constant Progression:  Unchanged Chronicity:  New Context: not MCA, not MVA and not physical stress   Relieved by:  Nothing Worsened by:  Nothing Ineffective treatments:  None tried Associated symptoms: no abdominal pain, no abdominal swelling, no bladder incontinence, no bowel incontinence, no chest pain, no dysuria, no fever, no headaches, no leg pain, no numbness, no paresthesias, no pelvic pain, no perianal numbness, no tingling, no weakness and no weight loss   Risk factors: no hx of cancer   Back pain.  Thought it might be a UTI as urine smelled badly.  No f/c/r.  No weakness no numbness.       Past Medical History:  Diagnosis Date  . Seizures (HCC)     There are no problems to display for this patient.   Past Surgical History:  Procedure Laterality Date  . FOOT SURGERY       OB History   No obstetric history on file.     History reviewed. No pertinent family history.  Social History   Tobacco Use  . Smoking status: Former Games developer  . Smokeless tobacco: Never Used  Substance Use Topics  . Alcohol use: No  . Drug use: No    Home Medications Prior to Admission medications   Medication Sig Start Date End Date Taking? Authorizing Provider  albuterol (PROVENTIL HFA;VENTOLIN HFA) 108 (90 Base) MCG/ACT inhaler Inhale 1-2 puffs into the lungs every 6 (six) hours as needed for wheezing or shortness of breath. 03/31/16   Domenick Gong, MD  amoxicillin-clavulanate (AUGMENTIN) 875-125 MG tablet  Take 1 tablet by mouth 2 (two) times daily. X 7 days 03/31/16   Domenick Gong, MD  Guaifenesin 1200 MG TB12 Take 1 tablet (1,200 mg total) by mouth 2 (two) times daily. 11/11/19   Lawyer, Cristal Deer, PA-C  guaiFENesin-codeine (CHERATUSSIN AC) 100-10 MG/5ML syrup Take 10 mLs by mouth 4 (four) times daily as needed for cough or congestion. 03/31/16   Domenick Gong, MD  ipratropium (ATROVENT) 0.06 % nasal spray Place 2 sprays into both nostrils 4 (four) times daily. 3-4 times/ day 03/31/16   Domenick Gong, MD  nystatin cream (MYCOSTATIN) Apply to affected area 2 times daily 08/29/16   Mackuen, Courteney Lyn, MD  phenazopyridine (PYRIDIUM) 100 MG tablet Take 100 mg by mouth 3 (three) times daily as needed for pain.    [provider]  promethazine-dextromethorphan (PROMETHAZINE-DM) 6.25-15 MG/5ML syrup Take 5 mLs by mouth 4 (four) times daily as needed for cough. 11/11/19   Lawyer, Cristal Deer, PA-C  Pseudoephedrine-Guaifenesin (MUCINEX D) [phone] MG TB12 Take 1 tablet by mouth 2 (two) times daily as needed (congestion). 03/31/16   Domenick Gong, MD  Spacer/Aero-Holding Chambers (AEROCHAMBER PLUS) inhaler Use as instructed 03/31/16   Domenick Gong, MD    Allergies    Patient has no known allergies.  Review of Systems   Review of Systems  Constitutional: Negative for fever and weight loss.  HENT: Negative for congestion.   Eyes: Negative for visual  disturbance.  Respiratory: Negative for shortness of breath.   Cardiovascular: Negative for chest pain.  Gastrointestinal: Negative for abdominal pain and bowel incontinence.  Genitourinary: Negative for bladder incontinence, dysuria and pelvic pain.  Musculoskeletal: Positive for back pain.  Skin: Negative for rash.  Neurological: Negative for tingling, weakness, numbness, headaches and paresthesias.  Psychiatric/Behavioral: Negative for agitation.  All other systems reviewed and are negative.   Physical Exam Updated  Vital Signs BP (!) 139/97 (BP Location: Left Arm)   Pulse 97   Temp 98.4 F (36.9 C) (Oral)   Resp 18   Ht 5\' 7"  (1.702 m)   Wt 95.3 kg   SpO2 100%   BMI 32.89 kg/m   Physical Exam Vitals and nursing note reviewed.  Constitutional:      General: She is not in acute distress.    Appearance: Normal appearance.  HENT:     Head: Normocephalic and atraumatic.     Nose: Nose normal.  Eyes:     Conjunctiva/sclera: Conjunctivae normal.     Pupils: Pupils are equal, round, and reactive to light.  Cardiovascular:     Rate and Rhythm: Normal rate and regular rhythm.     Pulses: Normal pulses.     Heart sounds: Normal heart sounds.  Pulmonary:     Effort: Pulmonary effort is normal.     Breath sounds: Normal breath sounds.  Abdominal:     General: Abdomen is flat. Bowel sounds are normal.     Tenderness: There is no abdominal tenderness. There is no guarding or rebound.  Musculoskeletal:        General: Normal range of motion.       Arms:     Cervical back: Normal range of motion and neck supple.  Skin:    General: Skin is warm and dry.     Capillary Refill: Capillary refill takes less than 2 seconds.  Neurological:     General: No focal deficit present.     Mental Status: She is alert and oriented to person, place, and time.     Deep Tendon Reflexes: Reflexes normal.  Psychiatric:        Mood and Affect: Mood normal.        Behavior: Behavior normal.     ED Results / Procedures / Treatments   Labs (all labs ordered are listed, but only abnormal results are displayed) Results for orders placed or performed during the hospital encounter of 06/05/20  Urinalysis, Routine w reflex microscopic Urine, Clean Catch  Result Value Ref Range   Color, Urine YELLOW YELLOW   APPearance CLEAR CLEAR   Specific Gravity, Urine >1.030 (H) 1.005 - 1.030   pH 5.5 5.0 - 8.0   Glucose, UA NEGATIVE NEGATIVE mg/dL   Hgb urine dipstick NEGATIVE NEGATIVE   Bilirubin Urine NEGATIVE NEGATIVE    Ketones, ur NEGATIVE NEGATIVE mg/dL   Protein, ur NEGATIVE NEGATIVE mg/dL   Nitrite NEGATIVE NEGATIVE   Leukocytes,Ua NEGATIVE NEGATIVE  Pregnancy, urine  Result Value Ref Range   Preg Test, Ur NEGATIVE NEGATIVE   No results found.  Radiology No results found.  Procedures Procedures (including critical care time)  Medications Ordered in ED Medications  lidocaine (LIDODERM) 5 % 1 patch (1 patch Transdermal Patch Applied 06/06/20 0019)  methocarbamol (ROBAXIN) tablet 1,000 mg (has no administration in time range)  acetaminophen (TYLENOL) tablet 1,000 mg (1,000 mg Oral Given 06/06/20 0019)  ibuprofen (ADVIL) tablet 800 mg (800 mg Oral Given 06/06/20 0019)  ED Course  I have reviewed the triage vital signs and the nursing notes.  Pertinent labs & imaging results that were available during my care of the patient were reviewed by me and considered in my medical decision making (see chart for details).    Muscle strain.  No UTI.  I do not believe this is a kidney stone.  Tylenol NSAIDs and muscle relaxants and heat therapy.  Patient verbalizes understanding.  Strict return given  Krista Crosby was evaluated in Emergency Department on 06/06/2020 for the symptoms described in the history of present illness. She was evaluated in the context of the global COVID-19 pandemic, which necessitated consideration that the patient might be at risk for infection with the SARS-CoV-2 virus that causes COVID-19. Institutional protocols and algorithms that pertain to the evaluation of patients at risk for COVID-19 are in a state of rapid change based on information released by regulatory bodies including the CDC and federal and state organizations. These policies and algorithms were followed during the patient's care in the ED.]  Final Clinical Impression(s) / ED Diagnoses  Return for intractable cough, coughing up blood,fevers >100.4 unrelieved by medication, shortness of breath, intractable  vomiting, chest pain, shortness of breath, weakness,numbness, changes in speech, facial asymmetry,abdominal pain, passing out,Inability to tolerate liquids or food, cough, altered mental status or any concerns. No signs of systemic illness or infection. The patient is nontoxic-appearing on exam and vital signs are within normal limits.   I have reviewed the triage vital signs and the nursing notes. Pertinent labs &imaging results that were available during my care of the patient were reviewed by me and considered in my medical decision making (see chart for details).After history, exam, and medical workup I feel the patient has beenappropriately medically screened and is safe for discharge home. Pertinent diagnoses were discussed with the patient. Patient was given return precautions.   Rolf Fells, MD 06/06/20 0120

## 2020-07-21 ENCOUNTER — Other Ambulatory Visit: Payer: Self-pay

## 2020-07-21 ENCOUNTER — Inpatient Hospital Stay (HOSPITAL_BASED_OUTPATIENT_CLINIC_OR_DEPARTMENT_OTHER)
Admission: EM | Admit: 2020-07-21 | Discharge: 2020-07-23 | DRG: 872 | Disposition: A | Discharge status: Home / Self Care | Payer: Medicaid Other | Attending: Internal Medicine | Admitting: Internal Medicine

## 2020-07-21 ENCOUNTER — Encounter (HOSPITAL_BASED_OUTPATIENT_CLINIC_OR_DEPARTMENT_OTHER): Payer: Self-pay | Admitting: *Deleted

## 2020-07-21 ENCOUNTER — Emergency Department (HOSPITAL_BASED_OUTPATIENT_CLINIC_OR_DEPARTMENT_OTHER): Payer: Medicaid Other

## 2020-07-21 DIAGNOSIS — G4733 Obstructive sleep apnea (adult) (pediatric): Secondary | ICD-10-CM | POA: Diagnosis present

## 2020-07-21 DIAGNOSIS — Z87891 Personal history of nicotine dependence: Secondary | ICD-10-CM | POA: Diagnosis not present

## 2020-07-21 DIAGNOSIS — Z791 Long term (current) use of non-steroidal anti-inflammatories (NSAID): Secondary | ICD-10-CM | POA: Diagnosis not present

## 2020-07-21 DIAGNOSIS — L03115 Cellulitis of right lower limb: Secondary | ICD-10-CM | POA: Diagnosis present

## 2020-07-21 DIAGNOSIS — A419 Sepsis, unspecified organism: Principal | ICD-10-CM | POA: Diagnosis present

## 2020-07-21 DIAGNOSIS — L02415 Cutaneous abscess of right lower limb: Secondary | ICD-10-CM | POA: Diagnosis present

## 2020-07-21 DIAGNOSIS — E876 Hypokalemia: Secondary | ICD-10-CM | POA: Diagnosis present

## 2020-07-21 DIAGNOSIS — Z6832 Body mass index (BMI) 32.0-32.9, adult: Secondary | ICD-10-CM

## 2020-07-21 DIAGNOSIS — E669 Obesity, unspecified: Secondary | ICD-10-CM | POA: Diagnosis present

## 2020-07-21 DIAGNOSIS — Z79899 Other long term (current) drug therapy: Secondary | ICD-10-CM

## 2020-07-21 DIAGNOSIS — L0390 Cellulitis, unspecified: Secondary | ICD-10-CM

## 2020-07-21 DIAGNOSIS — Z20822 Contact with and (suspected) exposure to covid-19: Secondary | ICD-10-CM | POA: Diagnosis present

## 2020-07-21 DIAGNOSIS — G40909 Epilepsy, unspecified, not intractable, without status epilepticus: Secondary | ICD-10-CM

## 2020-07-21 LAB — PROTIME-INR
INR: 1 (ref 0.8–1.2)
Prothrombin Time: 13.1 seconds (ref 11.4–15.2)

## 2020-07-21 LAB — URINALYSIS, ROUTINE W REFLEX MICROSCOPIC
Bilirubin Urine: NEGATIVE
Glucose, UA: NEGATIVE mg/dL
Hgb urine dipstick: NEGATIVE
Ketones, ur: NEGATIVE mg/dL
Leukocytes,Ua: NEGATIVE
Nitrite: NEGATIVE
Protein, ur: NEGATIVE mg/dL
Specific Gravity, Urine: 1.01 (ref 1.005–1.030)
pH: 6 (ref 5.0–8.0)

## 2020-07-21 LAB — CBC
HCT: 36.7 % (ref 36.0–46.0)
Hemoglobin: 11.6 g/dL — ABNORMAL LOW (ref 12.0–15.0)
MCH: 26.9 pg (ref 26.0–34.0)
MCHC: 31.6 g/dL (ref 30.0–36.0)
MCV: 85.2 fL (ref 80.0–100.0)
Platelets: 309 10*3/uL (ref 150–400)
RBC: 4.31 MIL/uL (ref 3.87–5.11)
RDW: 14.3 % (ref 11.5–15.5)
WBC: 15.4 10*3/uL — ABNORMAL HIGH (ref 4.0–10.5)
nRBC: 0 % (ref 0.0–0.2)

## 2020-07-21 LAB — LACTIC ACID, PLASMA
Lactic Acid, Venous: 1.7 mmol/L (ref 0.5–1.9)
Lactic Acid, Venous: 2.8 mmol/L (ref 0.5–1.9)
Lactic Acid, Venous: 1.9 mmol/L (ref 0.5–1.9)

## 2020-07-21 LAB — CBC WITH DIFFERENTIAL/PLATELET
Abs Immature Granulocytes: 0.07 10*3/uL (ref 0.00–0.07)
Basophils Absolute: 0 10*3/uL (ref 0.0–0.1)
Basophils Relative: 0 %
Eosinophils Absolute: 0.1 10*3/uL (ref 0.0–0.5)
Eosinophils Relative: 0 %
HCT: 39.2 % (ref 36.0–46.0)
Hemoglobin: 12.7 g/dL (ref 12.0–15.0)
Immature Granulocytes: 1 %
Lymphocytes Relative: 14 %
Lymphs Abs: 1.8 10*3/uL (ref 0.7–4.0)
MCH: 26.6 pg (ref 26.0–34.0)
MCHC: 32.4 g/dL (ref 30.0–36.0)
MCV: 82.2 fL (ref 80.0–100.0)
Monocytes Absolute: 1 10*3/uL (ref 0.1–1.0)
Monocytes Relative: 8 %
Neutro Abs: 9.9 10*3/uL — ABNORMAL HIGH (ref 1.7–7.7)
Neutrophils Relative %: 77 %
Platelets: 348 10*3/uL (ref 150–400)
RBC: 4.77 MIL/uL (ref 3.87–5.11)
RDW: 13.9 % (ref 11.5–15.5)
WBC: 12.9 10*3/uL — ABNORMAL HIGH (ref 4.0–10.5)
nRBC: 0 % (ref 0.0–0.2)

## 2020-07-21 LAB — COMPREHENSIVE METABOLIC PANEL
ALT: 23 U/L (ref 0–44)
AST: 24 U/L (ref 15–41)
Albumin: 3.6 g/dL (ref 3.5–5.0)
Alkaline Phosphatase: 47 U/L (ref 38–126)
Anion gap: 9 (ref 5–15)
BUN: 7 mg/dL (ref 6–20)
CO2: 21 mmol/L — ABNORMAL LOW (ref 22–32)
Calcium: 8.6 mg/dL — ABNORMAL LOW (ref 8.9–10.3)
Chloride: 102 mmol/L (ref 98–111)
Creatinine, Ser: 0.74 mg/dL (ref 0.44–1.00)
GFR calc non Af Amer: >60 mL/min (ref >60)
Glucose, Bld: 158 mg/dL — ABNORMAL HIGH (ref 70–99)
Potassium: 3.3 mmol/L — ABNORMAL LOW (ref 3.5–5.1)
Sodium: 132 mmol/L — ABNORMAL LOW (ref 135–145)
Total Bilirubin: 0.6 mg/dL (ref 0.3–1.2)
Total Protein: 7.2 g/dL (ref 6.5–8.1)

## 2020-07-21 LAB — PREGNANCY, URINE: Preg Test, Ur: NEGATIVE

## 2020-07-21 LAB — CREATININE, SERUM
Creatinine, Ser: 0.59 mg/dL (ref 0.44–1.00)
GFR calc non Af Amer: >60 mL/min (ref >60)

## 2020-07-21 LAB — RESPIRATORY PANEL BY RT PCR (FLU A&B, COVID)
Influenza A by PCR: NEGATIVE
Influenza B by PCR: NEGATIVE
SARS Coronavirus 2 by RT PCR: NEGATIVE

## 2020-07-21 LAB — APTT: aPTT: 31 seconds (ref 24–36)

## 2020-07-21 MED ORDER — SODIUM CHLORIDE 0.9 % IV SOLN
2.0000 g | INTRAVENOUS | Status: DC
  Administered 2020-07-22: 2 g via INTRAVENOUS
  Filled 2020-07-21 (×2): qty 20

## 2020-07-21 MED ORDER — ACETAMINOPHEN 650 MG RE SUPP: 650.0000 mg | Freq: Four times a day (QID) | RECTAL | Status: DC | PRN

## 2020-07-21 MED ORDER — HYDROMORPHONE HCL 1 MG/ML IJ SOLN
0.5000 mg | INTRAMUSCULAR | Status: DC | PRN
  Administered 2020-07-21 – 2020-07-23 (×8): 0.5 mg via INTRAVENOUS
  Filled 2020-07-21: qty 1
  Filled 2020-07-21 (×4): qty 0.5
  Filled 2020-07-21: qty 1
  Filled 2020-07-21 (×2): qty 0.5

## 2020-07-21 MED ORDER — ACETAMINOPHEN 160 MG/5ML PO SOLN
650.0000 mg | Freq: Once | ORAL | Status: AC
Start: 2020-07-21 — End: 2020-07-21
  Administered 2020-07-21: 650 mg via ORAL
  Filled 2020-07-21: qty 20.3

## 2020-07-21 MED ORDER — ACETAMINOPHEN 325 MG PO TABS
650.0000 mg | ORAL_TABLET | Freq: Four times a day (QID) | ORAL | Status: DC | PRN
  Administered 2020-07-22: 650 mg via ORAL
  Filled 2020-07-21: qty 2

## 2020-07-21 MED ORDER — FENTANYL CITRATE (PF) 100 MCG/2ML IJ SOLN
50.0000 ug | Freq: Once | INTRAMUSCULAR | Status: AC
  Administered 2020-07-21: 50 ug via INTRAVENOUS
  Filled 2020-07-21: qty 2

## 2020-07-21 MED ORDER — ONDANSETRON HCL 4 MG/2ML IJ SOLN
4.0000 mg | Freq: Once | INTRAMUSCULAR | Status: AC
  Administered 2020-07-21: 4 mg via INTRAVENOUS
  Filled 2020-07-21: qty 2

## 2020-07-21 MED ORDER — VANCOMYCIN HCL IN DEXTROSE 1-5 GM/200ML-% IV SOLN: 1000.0000 mg | Freq: Once | INTRAVENOUS | Status: DC

## 2020-07-21 MED ORDER — ENOXAPARIN SODIUM 40 MG/0.4ML ~~LOC~~ SOLN
40.0000 mg | SUBCUTANEOUS | Status: DC
  Administered 2020-07-21 – 2020-07-22 (×2): 40 mg via SUBCUTANEOUS
  Filled 2020-07-21 (×2): qty 0.4

## 2020-07-21 MED ORDER — KETOROLAC TROMETHAMINE 15 MG/ML IJ SOLN
15.0000 mg | Freq: Four times a day (QID) | INTRAMUSCULAR | Status: DC | PRN
  Administered 2020-07-22 – 2020-07-23 (×3): 15 mg via INTRAVENOUS
  Filled 2020-07-21 (×3): qty 1

## 2020-07-21 MED ORDER — SODIUM CHLORIDE 0.9 % IV SOLN
2.0000 g | Freq: Once | INTRAVENOUS | Status: AC
  Administered 2020-07-21: 2 g via INTRAVENOUS
  Filled 2020-07-21: qty 20

## 2020-07-21 MED ORDER — ONDANSETRON HCL 4 MG/2ML IJ SOLN: 4.0000 mg | Freq: Four times a day (QID) | INTRAMUSCULAR | Status: DC | PRN

## 2020-07-21 MED ORDER — VANCOMYCIN HCL IN DEXTROSE 1-5 GM/200ML-% IV SOLN
1000.0000 mg | Freq: Once | INTRAVENOUS | Status: AC
  Administered 2020-07-21: 1000 mg via INTRAVENOUS
  Filled 2020-07-21: qty 200

## 2020-07-21 MED ORDER — ONDANSETRON HCL 4 MG PO TABS: 4.0000 mg | ORAL_TABLET | Freq: Four times a day (QID) | ORAL | Status: DC | PRN

## 2020-07-21 MED ORDER — VANCOMYCIN HCL IN DEXTROSE 1-5 GM/200ML-% IV SOLN
1000.0000 mg | Freq: Two times a day (BID) | INTRAVENOUS | Status: DC
  Administered 2020-07-21 – 2020-07-23 (×4): 1000 mg via INTRAVENOUS
  Filled 2020-07-21 (×5): qty 200

## 2020-07-21 MED ORDER — SODIUM CHLORIDE 0.9 % IV SOLN: INTRAVENOUS | Status: DC | PRN

## 2020-07-21 MED ORDER — LIDOCAINE-EPINEPHRINE (PF) 2 %-1:200000 IJ SOLN
INTRAMUSCULAR | Status: AC
  Administered 2020-07-21: 20 mL
  Filled 2020-07-21: qty 20

## 2020-07-21 MED ORDER — SODIUM CHLORIDE 0.9 % IV BOLUS (SEPSIS)
1000.0000 mL | Freq: Once | INTRAVENOUS | Status: AC
  Administered 2020-07-21: 1000 mL via INTRAVENOUS

## 2020-07-21 MED ORDER — LIDOCAINE-EPINEPHRINE 2 %-1:100000 IJ SOLN
20.0000 mL | Freq: Once | INTRAMUSCULAR | Status: AC
  Administered 2020-07-21: 20 mL via INTRADERMAL

## 2020-07-21 MED ORDER — LACTATED RINGERS IV SOLN: INTRAVENOUS | Status: DC

## 2020-07-21 NOTE — Progress Notes (Signed)
Notified bedside nurse of need to draw repeat lactic acid. 

## 2020-07-21 NOTE — H&P (Signed)
History and Physical   KARLEE STAFF DSK:[id number] DOB: [birth year] DOA: 07/21/2020  Referring MD/NP/PA: Arthor Captain, PA  PCP: Pcp, No   Outpatient Specialists: None  Patient coming from: Home via [facility]  Chief Complaint: Right lower extremity pain and also  HPI: Krista Crosby is a 44 y.o. female with medical history significant of seizure disorder, obstructive sleep apnea, morbid obesity who presented to med [facility] complaining of right lower leg swelling also and the pimple bump.  It started rapidly in the last 24 hours after which she suspected was something biting her.  She did not see what it was.  Patient was seen and evaluated and found to have an abscess in the right lower extremity around the lateral aspect of the leg.  Incision and drainage performed in the ER.  Patient was found to be septic meeting the SIRS criteria plus the infected wound and abscess.  She has had associated fever malaise loss of appetite and weakness.  Patient was COVID-19 negative and has been vaccinated with the first dose of her Covid vaccine.  Patient admitted for IV antibiotics and wound care.  ED Course: Lactate of 2.8.  Temperature 100.7 blood pressure 150/86 pulse 125 respirate of 36 and oxygen sats 96% on room air.  White count 15.4 hemoglobin 11.6 platelets 309.  Urinalysis essentially negative.  Chest x-ray showed no acute findings.  Chemistry appears to be within normal.  Patient admitted with sepsis due to infected right lower extremity wound.  Review of Systems: As per HPI otherwise 10 point review of systems negative.    Past Medical History:  Diagnosis Date  . Seizures (HCC)     Past Surgical History:  Procedure Laterality Date  . FOOT SURGERY       reports that she has quit smoking. She has never used smokeless tobacco. She reports that she does not drink alcohol and does not use drugs.  No Known Allergies  No family history on file.   Prior to  Admission medications   Medication Sig Start Date End Date Taking? Authorizing Provider  albuterol (PROVENTIL HFA;VENTOLIN HFA) 108 (90 Base) MCG/ACT inhaler Inhale 1-2 puffs into the lungs every 6 (six) hours as needed for wheezing or shortness of breath. 03/31/16   Domenick Gong, MD  amoxicillin-clavulanate (AUGMENTIN) 875-125 MG tablet Take 1 tablet by mouth 2 (two) times daily. X 7 days 03/31/16   Domenick Gong, MD  Guaifenesin 1200 MG TB12 Take 1 tablet (1,200 mg total) by mouth 2 (two) times daily. 11/11/19   Lawyer, Cristal Deer, PA-C  guaiFENesin-codeine (CHERATUSSIN AC) 100-10 MG/5ML syrup Take 10 mLs by mouth 4 (four) times daily as needed for cough or congestion. 03/31/16   Domenick Gong, MD  ipratropium (ATROVENT) 0.06 % nasal spray Place 2 sprays into both nostrils 4 (four) times daily. 3-4 times/ day 03/31/16   Domenick Gong, MD  lidocaine (LIDODERM) 5 % Place 1 patch onto the skin daily. Remove & Discard patch within 12 hours or as directed by MD 06/06/20   Nicanor Alcon, April, MD  methocarbamol (ROBAXIN) 500 MG tablet Take 1 tablet (500 mg total) by mouth 2 (two) times daily. 06/06/20   Palumbo, April, MD  naproxen (NAPROSYN) 375 MG tablet Take 1 tablet (375 mg total) by mouth 2 (two) times daily. 06/06/20   Palumbo, April, MD  nystatin cream (MYCOSTATIN) Apply to affected area 2 times daily 08/29/16   Mackuen, Courteney Lyn, MD  phenazopyridine (PYRIDIUM) 100 MG tablet Take  100 mg by mouth 3 (three) times daily as needed for pain.    [provider]  promethazine-dextromethorphan (PROMETHAZINE-DM) 6.25-15 MG/5ML syrup Take 5 mLs by mouth 4 (four) times daily as needed for cough. 11/11/19   Lawyer, Cristal Deerhristopher, PA-C  Pseudoephedrine-Guaifenesin (MUCINEX D) [phone] MG TB12 Take 1 tablet by mouth 2 (two) times daily as needed (congestion). 03/31/16   Domenick GongMortenson, Ashley, MD  Spacer/Aero-Holding Chambers (AEROCHAMBER PLUS) inhaler Use as instructed 03/31/16   Domenick GongMortenson, Ashley, MD      Physical Exam: Vitals:   07/21/20 1800 07/21/20 1830 07/21/20 2007 07/21/20 2232  BP: 131/90 (!) 150/86 134/78   Pulse: 94 (!) 106 81 84  Resp: (!) 21 (!) 22 18 17   Temp: 98.4 F (36.9 C)     TempSrc:      SpO2: 97% 100% 100% 100%  Weight:      Height:          Constitutional: Obese, stable, no distress Vitals:   07/21/20 1800 07/21/20 1830 07/21/20 2007 07/21/20 2232  BP: 131/90 (!) 150/86 134/78   Pulse: 94 (!) 106 81 84  Resp: (!) 21 (!) 22 18 17   Temp: 98.4 F (36.9 C)     TempSrc:      SpO2: 97% 100% 100% 100%  Weight:      Height:       Eyes: PERRL, lids and conjunctivae normal ENMT: Mucous membranes are dry. Posterior pharynx clear of any exudate or lesions.Normal dentition.  Neck: normal, supple, no masses, no thyromegaly Respiratory: clear to auscultation bilaterally, no wheezing, no crackles. Normal respiratory effort. No accessory muscle use.  Cardiovascular: Sinus tachycardia no murmurs / rubs / gallops. No extremity edema. 2+ pedal pulses. No carotid bruits.  Abdomen: no tenderness, no masses palpated. No hepatosplenomegaly. Bowel sounds positive.  Musculoskeletal: no clubbing / cyanosis. No joint deformity upper and lower extremities. Good ROM, no contractures. Normal muscle tone.  Skin: Right lower extremity lesion on the lateral aspect of the lower leg also following incision and drainage, dressed, draining thin fluid Neurologic: CN 2-12 grossly intact. Sensation intact, DTR normal. Strength 5/5 in all 4.  Psychiatric: Normal judgment and insight. Alert and oriented x 3. Normal mood.     Labs on Admission: I have personally reviewed following labs and imaging studies  CBC: Recent Labs  Lab 07/21/20 1246 07/21/20 2139  WBC 12.9* 15.4*  NEUTROABS 9.9*  --   HGB 12.7 11.6*  HCT 39.2 36.7  MCV 82.2 85.2  PLT 348 309   Basic Metabolic Panel: Recent Labs  Lab 07/21/20 1246 07/21/20 2139  NA 132*  --   K 3.3*  --   CL 102  --   CO2 21*   --   GLUCOSE 158*  --   BUN 7  --   CREATININE 0.74 0.59  CALCIUM 8.6*  --    GFR: Estimated Creatinine Clearance: 106.4 mL/min (by C-G formula based on SCr of 0.59 mg/dL). Liver Function Tests: Recent Labs  Lab 07/21/20 1246  AST 24  ALT 23  ALKPHOS 47  BILITOT 0.6  PROT 7.2  ALBUMIN 3.6   No results for input(s): LIPASE, AMYLASE in the last 168 hours. No results for input(s): AMMONIA in the last 168 hours. Coagulation Profile: Recent Labs  Lab 07/21/20 1246  INR 1.0   Cardiac Enzymes: No results for input(s): CKTOTAL, CKMB, CKMBINDEX, TROPONINI in the last 168 hours. BNP (last 3 results) No results for input(s): PROBNP in the last 8760 hours.  HbA1C: No results for input(s): HGBA1C in the last 72 hours. CBG: No results for input(s): GLUCAP in the last 168 hours. Lipid Profile: No results for input(s): CHOL, HDL, LDLCALC, TRIG, CHOLHDL, LDLDIRECT in the last 72 hours. Thyroid Function Tests: No results for input(s): TSH, T4TOTAL, FREET4, T3FREE, THYROIDAB in the last 72 hours. Anemia Panel: No results for input(s): VITAMINB12, FOLATE, FERRITIN, TIBC, IRON, RETICCTPCT in the last 72 hours. Urine analysis:    Component Value Date/Time   COLORURINE YELLOW 07/21/2020 1448   APPEARANCEUR CLEAR 07/21/2020 1448   LABSPEC 1.010 07/21/2020 1448   PHURINE 6.0 07/21/2020 1448   GLUCOSEU NEGATIVE 07/21/2020 1448   HGBUR NEGATIVE 07/21/2020 1448   BILIRUBINUR NEGATIVE 07/21/2020 1448   KETONESUR NEGATIVE 07/21/2020 1448   PROTEINUR NEGATIVE 07/21/2020 1448   NITRITE NEGATIVE 07/21/2020 1448   LEUKOCYTESUR NEGATIVE 07/21/2020 1448   Sepsis Labs: @LABRCNTIP (procalcitonin:4,lacticidven:4) ) Recent Results (from the past 240 hour(s))  Respiratory Panel by RT PCR (Flu A&B, Covid) - Nasopharyngeal Swab     Status: None   Collection Time: 07/21/20  1:15 PM   Specimen: Nasopharyngeal Swab  Result Value Ref Range Status   SARS Coronavirus 2 by RT PCR NEGATIVE NEGATIVE Final     Comment: (NOTE) SARS-CoV-2 target nucleic acids are NOT DETECTED.  The SARS-CoV-2 RNA is generally detectable in upper respiratoy specimens during the acute phase of infection. The lowest concentration of SARS-CoV-2 viral copies this assay can detect is 131 copies/mL. A negative result does not preclude SARS-Cov-2 infection and should not be used as the sole basis for treatment or other patient management decisions. A negative result may occur with  improper specimen collection/handling, submission of specimen other than nasopharyngeal swab, presence of viral mutation(s) within the areas targeted by this assay, and inadequate number of viral copies (<131 copies/mL). A negative result must be combined with clinical observations, patient history, and epidemiological information. The expected result is Negative.  Fact Sheet for Patients:  09/20/20  Fact Sheet for Healthcare Providers:  https://www.moore.com/  This test is no t yet approved or cleared by the https://www.young.biz/ FDA and  has been authorized for detection and/or diagnosis of SARS-CoV-2 by FDA under an Emergency Use Authorization (EUA). This EUA will remain  in effect (meaning this test can be used) for the duration of the COVID-19 declaration under Section 564(b)(1) of the Act, 21 U.S.C. section 360bbb-3(b)(1), unless the authorization is terminated or revoked sooner.     Influenza A by PCR NEGATIVE NEGATIVE Final   Influenza B by PCR NEGATIVE NEGATIVE Final    Comment: (NOTE) The Xpert Xpress SARS-CoV-2/FLU/RSV assay is intended as an aid in  the diagnosis of influenza from Nasopharyngeal swab specimens and  should not be used as a sole basis for treatment. Nasal washings and  aspirates are unacceptable for Xpert Xpress SARS-CoV-2/FLU/RSV  testing.  Fact Sheet for Patients: [place]  Fact Sheet for Healthcare  Providers: https://www.moore.com/  This test is not yet approved or cleared by the https://www.young.biz/ FDA and  has been authorized for detection and/or diagnosis of SARS-CoV-2 by  FDA under an Emergency Use Authorization (EUA). This EUA will remain  in effect (meaning this test can be used) for the duration of the  Covid-19 declaration under Section 564(b)(1) of the Act, 21  U.S.C. section 360bbb-3(b)(1), unless the authorization is  terminated or revoked. Performed at [facility], [address] [place], [place]   Wound or Superficial Culture  Status: None (Preliminary result)   Collection Time: 07/21/20  2:23 PM   Specimen: Wound; Abscess  Result Value Ref Range Status   Specimen Description WOUND RIGHT LEG  Final   Special Requests LATERAL  Final   Gram Stain   Final    FEW WBC PRESENT, PREDOMINANTLY PMN RARE GRAM POSITIVE COCCI Performed at [facility], [address] [place], [place] [address]    Culture PENDING  Incomplete   Report Status PENDING  Incomplete     Radiological Exams on Admission: DG Chest Port 1 View  Result Date: 07/21/2020 CLINICAL DATA:  Questionable sepsis. EXAM: PORTABLE CHEST 1 VIEW COMPARISON:  11/11/2019. FINDINGS: Mediastinum and hilar structures normal. Heart size normal. Mild right base atelectasis/infiltrate. No pleural effusion or pneumothorax. IMPRESSION: Mild right base atelectasis/infiltrate. Electronically Signed   By: Maisie Fus  Register   On: 07/21/2020 15:29      Assessment/Plan Principal Problem:   Sepsis (HCC) Active Problems:   Seizure disorder (HCC)   OSA (obstructive sleep apnea)   Morbid (severe) obesity due to excess calories (HCC)   Abscess of right leg    #1 sepsis: Secondary to right lower extremity abscess.  Patient admitted.  Initiated the sepsis protocol.  IV fluid boluses and maintenance.  IV vancomycin and Rocephin.  Blood cultures obtained.  Wound cultures obtained.   Will de-escalate antibiotics based on culture results.  #2 right lower extremity abscess: Status post incision and drainage.  Per patient this appears to be secondary to some insect bite.  "Wound care will continue.  #3 obstructive sleep apnea: CPAP at night.  #4 history of seizure disorder: Resume continue home medications.  #5 morbid obesity: Dietary counseling.    DVT prophylaxis: Lovenox Code Status: Full code Family Communication: No family at bedside Disposition Plan: Home Consults called: None but wound care in the morning Admission status: Inpatient  Severity of Illness: The appropriate patient status for this patient is INPATIENT. Inpatient status is judged to be reasonable and necessary in order to provide the required intensity of service to ensure the patient's safety. The patient's presenting symptoms, physical exam findings, and initial radiographic and laboratory data in the context of their chronic comorbidities is felt to place them at high risk for further clinical deterioration. Furthermore, it is not anticipated that the patient will be medically stable for discharge from the hospital within 2 midnights of admission. The following factors support the patient status of inpatient.   " The patient's presenting symptoms include right lower extremity swelling and also. " The worrisome physical exam findings include ulcerated lesion dressed. " The initial radiographic and laboratory data are worrisome because of leukocytosis and evidence of sepsis. " The chronic co-morbidities include seizure disorder.   * I certify that at the point of admission it is my clinical judgment that the patient will require inpatient hospital care spanning beyond 2 midnights from the point of admission due to high intensity of service, high risk for further deterioration and high frequency of surveillance required.Lonia Blood MD [facility] Pager [phone]  If 7PM-7AM,  please contact night-coverage [email or web address] Password [email or web address]  07/21/2020, 11:28 PM

## 2020-07-21 NOTE — ED Notes (Signed)
Pain medicine given prior to [facility] transport

## 2020-07-21 NOTE — ED Notes (Signed)
BC obtained prior to Abx initiated

## 2020-07-21 NOTE — ED Provider Notes (Signed)
[facility] EMERGENCY DEPARTMENT Provider Note   CSN: [id number] Arrival date & time: 07/21/20  1231     History Chief Complaint  Patient presents with  . Recurrent Skin Infections    Code Sepsis initiated due to SIRS+ Source.  Krista Crosby is a 44 y.o. female who presents with RLE abscess. Patient states " I got bit by something." She noticed a painful bump on her RLE yesterday morning which has rapidly progressed. She has never had anything like this before. She denies injury to the leg. She states that her entire leg is painful and she is having difficulty bearing weight on the leg.  States that she has had associated malaise, fatigue and loss of appetite.  She is noted to be tachycardic, tachypneic and febrile in triage.  She has been vaccinated with a single [employer] vaccine she states "when they first came out."  She denies cough, shortness of breath, nausea, vomiting, urinary symptoms.  She denies a history of diabetes.  HPI     Past Medical History:  Diagnosis Date  . Seizures [facility])     Patient Active Problem List   Diagnosis Date Noted  . Sepsis (HCC) 07/21/2020    Past Surgical History:  Procedure Laterality Date  . FOOT SURGERY       OB History   No obstetric history on file.     No family history on file.  Social History   Tobacco Use  . Smoking status: Former Games developer  . Smokeless tobacco: Never Used  Substance Use Topics  . Alcohol use: No  . Drug use: No    Home Medications Prior to Admission medications   Medication Sig Start Date End Date Taking? Authorizing Provider  albuterol (PROVENTIL HFA;VENTOLIN HFA) 108 (90 Base) MCG/ACT inhaler Inhale 1-2 puffs into the lungs every 6 (six) hours as needed for wheezing or shortness of breath. 03/31/16   Domenick Gong, MD  amoxicillin-clavulanate (AUGMENTIN) 875-125 MG tablet Take 1 tablet by mouth 2 (two) times daily. X 7 days 03/31/16   Domenick Gong, MD  Guaifenesin 1200  MG TB12 Take 1 tablet (1,200 mg total) by mouth 2 (two) times daily. 11/11/19   Lawyer, Cristal Deer, PA-C  guaiFENesin-codeine (CHERATUSSIN AC) 100-10 MG/5ML syrup Take 10 mLs by mouth 4 (four) times daily as needed for cough or congestion. 03/31/16   Domenick Gong, MD  ipratropium (ATROVENT) 0.06 % nasal spray Place 2 sprays into both nostrils 4 (four) times daily. 3-4 times/ day 03/31/16   Domenick Gong, MD  lidocaine (LIDODERM) 5 % Place 1 patch onto the skin daily. Remove & Discard patch within 12 hours or as directed by MD 06/06/20   Nicanor Alcon, April, MD  methocarbamol (ROBAXIN) 500 MG tablet Take 1 tablet (500 mg total) by mouth 2 (two) times daily. 06/06/20   Palumbo, April, MD  naproxen (NAPROSYN) 375 MG tablet Take 1 tablet (375 mg total) by mouth 2 (two) times daily. 06/06/20   Palumbo, April, MD  nystatin cream (MYCOSTATIN) Apply to affected area 2 times daily 08/29/16   Mackuen, Courteney Lyn, MD  phenazopyridine (PYRIDIUM) 100 MG tablet Take 100 mg by mouth 3 (three) times daily as needed for pain.    [provider]  promethazine-dextromethorphan (PROMETHAZINE-DM) 6.25-15 MG/5ML syrup Take 5 mLs by mouth 4 (four) times daily as needed for cough. 11/11/19   Lawyer, Cristal Deer, PA-C  Pseudoephedrine-Guaifenesin (MUCINEX D) [phone] MG TB12 Take 1 tablet by mouth 2 (two) times daily as needed (congestion).  03/31/16   Domenick GongMortenson, Ashley, MD  Spacer/Aero-Holding Chambers (AEROCHAMBER PLUS) inhaler Use as instructed 03/31/16   Domenick GongMortenson, Ashley, MD    Allergies    Patient has no known allergies.  Review of Systems   Review of Systems  Physical Exam Updated Vital Signs BP (!) 150/86   Pulse (!) 106   Temp 98.4 F (36.9 C)   Resp (!) 22   Ht 5\' 7"  (1.702 m)   Wt 95.3 kg   SpO2 100%   BMI 32.89 kg/m   Physical Exam Vitals and nursing note reviewed.  Constitutional:      General: She is not in acute distress.    Appearance: She is well-developed. She is not diaphoretic.    HENT:     Head: Normocephalic and atraumatic.  Eyes:     General: No scleral icterus.    Conjunctiva/sclera: Conjunctivae normal.  Cardiovascular:     Rate and Rhythm: Regular rhythm. Tachycardia present.     Heart sounds: Normal heart sounds. No murmur heard.  No friction rub. No gallop.   Pulmonary:     Effort: Pulmonary effort is normal. Tachypnea present. No respiratory distress.     Breath sounds: Normal breath sounds.  Abdominal:     General: Bowel sounds are normal. There is no distension.     Palpations: Abdomen is soft. There is no mass.     Tenderness: There is no abdominal tenderness. There is no guarding.  Musculoskeletal:     Cervical back: Normal range of motion.  Skin:    General: Skin is warm and dry.     Comments: Significant swelling and erythema of the right lower extremity on the lateral side.  There is a 2 x 2 centimeter area of fluctuance with surrounding induration and erythema.  No obvious lymphangitis.  It is exquisitely tender to palpation  Neurological:     Mental Status: She is alert and oriented to person, place, and time.  Psychiatric:        Behavior: Behavior normal.     ED Results / Procedures / Treatments   Labs (all labs ordered are listed, but only abnormal results are displayed) Labs Reviewed  COMPREHENSIVE METABOLIC PANEL - Abnormal; Notable for the following components:      Result Value   Sodium 132 (*)    Potassium 3.3 (*)    CO2 21 (*)    Glucose, Bld 158 (*)    Calcium 8.6 (*)    All other components within normal limits  LACTIC ACID, PLASMA - Abnormal; Notable for the following components:   Lactic Acid, Venous 2.8 (*)    All other components within normal limits  CBC WITH DIFFERENTIAL/PLATELET - Abnormal; Notable for the following components:   WBC 12.9 (*)    Neutro Abs 9.9 (*)    All other components within normal limits  RESPIRATORY PANEL BY RT PCR (FLU A&B, COVID)  AEROBIC CULTURE (SUPERFICIAL SPECIMEN)  CULTURE,  BLOOD (ROUTINE X 2)  CULTURE, BLOOD (ROUTINE X 2)  URINE CULTURE  LACTIC ACID, PLASMA  URINALYSIS, ROUTINE W REFLEX MICROSCOPIC  PREGNANCY, URINE  APTT  PROTIME-INR    EKG EKG Interpretation  Date/Time:  Thursday July 21 2020 13:42:06 EDT Ventricular Rate:  109 PR Interval:    QRS Duration: 72 QT Interval:  359 QTC Calculation: 484 R Axis:   38 Text Interpretation: Sinus tachycardia No previous tracing Confirmed by Cathren LaineSteinl, Kevin ([id number]) on 07/21/2020 2:12:48 PM   Radiology [facility] [address]  Result Date: 07/21/2020 CLINICAL DATA:  Questionable sepsis. EXAM: PORTABLE CHEST 1 VIEW COMPARISON:  11/11/2019. FINDINGS: Mediastinum and hilar structures normal. Heart size normal. Mild right base atelectasis/infiltrate. No pleural effusion or pneumothorax. IMPRESSION: Mild right base atelectasis/infiltrate. Electronically Signed   By: Maisie Fus  Register   On: 07/21/2020 15:29    Procedures .Marland KitchenIncision and Drainage  Date/Time: 07/21/2020 6:00 PM Performed by: Arthor Captain, PA-C Authorized by: Arthor Captain, PA-C   Consent:    Consent obtained:  Verbal   Consent given by:  Patient   Risks discussed:  Bleeding, incomplete drainage, pain and damage to other organs   Alternatives discussed:  No treatment Universal protocol:    Procedure explained and questions answered to patient or proxy's satisfaction: yes     Relevant documents present and verified: yes     Test results available and properly labeled: yes     Imaging studies available: yes     Required blood products, implants, devices, and special equipment available: yes     Site/side marked: yes     Immediately prior to procedure a time out was called: yes     Patient identity confirmed:  Verbally with patient Location:    Type:  Abscess   Size:  2 cm   Location:  Lower extremity   Lower extremity location:  Leg   Leg location:  R lower leg Pre-procedure details:    Skin preparation:  Betadine Anesthesia (see  MAR for exact dosages):    Anesthesia method:  Local infiltration   Local anesthetic:  Lidocaine 2% WITH epi Procedure type:    Complexity:  Complex Procedure details:    Needle aspiration: no     Incision types:  Cruciate   Incision depth:  Subcutaneous   Scalpel blade:  11   Wound management:  Probed and deloculated, irrigated with saline and extensive cleaning   Drainage:  Purulent and bloody   Drainage amount:  Moderate   Wound treatment:  Wound left open   Packing materials:  1/4 in gauze Post-procedure details:    Patient tolerance of procedure:  Tolerated well, no immediate complications  .Critical Care Performed by: Arthor Captain, PA-C Authorized by: Arthor Captain, PA-C   Critical care provider statement:    Critical care time (minutes):  45   Critical care time was exclusive of:  Separately billable procedures and treating other patients   Critical care was necessary to treat or prevent imminent or life-threatening deterioration of the following conditions:  Sepsis   Critical care was time spent personally by me on the following activities:  Discussions with consultants, evaluation of patient's response to treatment, examination of patient, ordering and performing treatments and interventions, ordering and review of laboratory studies, ordering and review of radiographic studies, pulse oximetry, re-evaluation of patient's condition, obtaining history from patient or surrogate and review of old charts   (including critical care time)  Medications Ordered in ED Medications  lactated ringers infusion ( Intravenous New Bag/Given 07/21/20 1707)  vancomycin (VANCOCIN) IVPB 1000 mg/200 mL premix (0 mg Intravenous Stopped 07/21/20 1651)  0.9 %  sodium chloride infusion ( Intravenous New Bag/Given 07/21/20 1412)  HYDROmorphone (DILAUDID) injection 0.5 mg (0.5 mg Intravenous Given 07/21/20 1833)  acetaminophen (TYLENOL) 160 MG/5ML solution 650 mg (650 mg Oral Given 07/21/20 1305)    sodium chloride 0.9 % bolus 1,000 mL (0 mLs Intravenous Stopped 07/21/20 1433)    And  sodium chloride 0.9 % bolus 1,000 mL (0 mLs Intravenous Stopped 07/21/20 1553)  And  sodium chloride 0.9 % bolus 1,000 mL (0 mLs Intravenous Stopped 07/21/20 1651)  vancomycin (VANCOCIN) IVPB 1000 mg/200 mL premix ( Intravenous Stopped 07/21/20 1518)  cefTRIAXone (ROCEPHIN) 2 g in sodium chloride 0.9 % 100 mL IVPB (0 g Intravenous Stopped 07/21/20 1409)  lidocaine-EPINEPHrine (XYLOCAINE W/EPI) 2 %-1:100000 (with pres) injection 20 mL (20 mLs Intradermal Given 07/21/20 1415)  lidocaine-EPINEPHrine (XYLOCAINE W/EPI) 2 %-1:200000 (PF) injection (20 mLs  Given 07/21/20 1331)  fentaNYL (SUBLIMAZE) injection 50 mcg (50 mcg Intravenous Given 07/21/20 1540)  ondansetron (ZOFRAN) injection 4 mg (4 mg Intravenous Given 07/21/20 1539)    ED Course  I have reviewed the triage vital signs and the nursing notes.  Pertinent labs & imaging results that were available during my care of the patient were reviewed by me and considered in my medical decision making (see chart for details).    MDM Rules/Calculators/A&P                          CC:leg pain  VS: BP (!) 150/86   Pulse (!) 106   Temp 98.4 F (36.9 C)   Resp (!) 22   Ht 5\' 7"  (1.702 m)   Wt 95.3 kg   SpO2 100%   BMI 32.89 kg/m   is gathered by patient  and emr. Previous records obtained and reviewed. DDX:The patient's complaint of leg pain involves an extensive number of diagnostic and treatment options, and is a complaint that carries with it a high risk of complications, morbidity, and potential mortality. Given the large differential diagnosis, medical decision making is of high complexity. Abscess, dvt, cellulitis, necrotizing fasciitis, compartment sydrome, radiculopathy, MSK injury, arterial insufficiency, myositis Labs: I ordered reviewed and interpreted labs which include CBC which showed elevated white blood cell count of 12.9 thousand,  CMP with mild hypokalemia, mild hyponatremia elevated blood glucose of 158.  Respiratory panel including Covid, flu a and flu B are negative.  APTT PT/INR within normal limits, urinalysis without evidence of infection.  Pregnancy test is negative.  Initial lactic acid elevated to 2.8 now 1.7 after fluids Imaging: I ordered and reviewed images which included . I independently visualized and interpreted all imaging.  1 view chest x-ray . there are no acute, significant findings on today's images. EKG: sinus tach 109 Consults:Dr. Pokhrel for admission WG:[id number] here with sirs+ source-  Abscess and cellulitis of RLE. I&D performed Patient given fluids with normalizing lactic acid but continued tachycardia. Patient will be admitted. Treated with 4ml/kg fluids and vanc + Rocephin Patient disposition:The patient appears reasonably stabilized for admission considering the current resources, flow, and capabilities available in the ED at this time, and I doubt any other [facility] requiring further screening and/or treatment in the ED prior to admission.        Final Clinical Impression(s) / ED Diagnoses Final diagnoses:  Sepsis due to cellulitis [facility])    Rx / DC Orders ED Discharge Orders    None       [facility], PA-C 07/21/20 1856    09/20/20, MD 07/22/20 [phone]

## 2020-07-21 NOTE — ED Triage Notes (Signed)
Pt is here with wound that is draining on her right lower leg.  This began 2 days ago as a little pimple and has become more inflamed and began draining and is painful.  Pt states that she "does not feel good and lost appetite" Temp 100.64F and HR 125 in triage. Pt has a second spot on her leg that is developing, no hx of MRSA.

## 2020-07-21 NOTE — ED Notes (Signed)
Date and time results received: 07/21/20 1323   Test:lactic acid Critical Value: 2.8  Name of Provider Notified: Cammy Copa PA Orders Received? Or Actions Taken?: no orders given

## 2020-07-21 NOTE — ED Notes (Signed)
Pt up to bedside commode without assistance.

## 2020-07-21 NOTE — ED Notes (Signed)
COVID SWAB OBTAINED AND TO THE LAB 

## 2020-07-21 NOTE — Progress Notes (Signed)
Pharmacy Antibiotic Note  Krista Crosby is a 44 y.o. female admitted on 07/21/2020 with cellulitis.  Pharmacy has been consulted for vancomycin dosing.  Plan: Vancomycin 1 gram IV every 12 hours.  Goal : AUC ~ 500 mg*hr/mL .  Height: 5\' 7"  (170.2 cm) Weight: 95.3 kg (210 lb) IBW/kg (Calculated) : 61.6  Temp (24hrs), Avg:100.5 F (38.1 C), Min:100.3 F (37.9 C), Max:100.7 F (38.2 C)  Recent Labs  Lab 07/21/20 1246  WBC 12.9*  CREATININE 0.74  LATICACIDVEN 2.8*    Estimated Creatinine Clearance: 106.4 mL/min (by C-G formula based on SCr of 0.74 mg/dL).    No Known Allergies  Antimicrobials this admission: Vancomycin 1 gram x 1 Ceftriaxone 2 grams x 1  Microbiology results: Pending  Thank you for allowing pharmacy to be a part of this patient's care.  09/20/20 A Garlan Drewes 07/21/2020 1:41 PM

## 2020-07-22 DIAGNOSIS — L02415 Cutaneous abscess of right lower limb: Secondary | ICD-10-CM | POA: Diagnosis not present

## 2020-07-22 DIAGNOSIS — G4733 Obstructive sleep apnea (adult) (pediatric): Secondary | ICD-10-CM | POA: Diagnosis not present

## 2020-07-22 DIAGNOSIS — A419 Sepsis, unspecified organism: Secondary | ICD-10-CM | POA: Diagnosis not present

## 2020-07-22 LAB — PROCALCITONIN: Procalcitonin: <0.1 ng/mL

## 2020-07-22 LAB — COMPREHENSIVE METABOLIC PANEL
ALT: 18 U/L (ref 0–44)
AST: 14 U/L — ABNORMAL LOW (ref 15–41)
Albumin: 3 g/dL — ABNORMAL LOW (ref 3.5–5.0)
Alkaline Phosphatase: 43 U/L (ref 38–126)
Anion gap: 10 (ref 5–15)
BUN: <5 mg/dL — ABNORMAL LOW (ref 6–20)
CO2: 23 mmol/L (ref 22–32)
Calcium: 8.3 mg/dL — ABNORMAL LOW (ref 8.9–10.3)
Chloride: 105 mmol/L (ref 98–111)
Creatinine, Ser: 0.72 mg/dL (ref 0.44–1.00)
GFR calc non Af Amer: >60 mL/min (ref >60)
Glucose, Bld: 121 mg/dL — ABNORMAL HIGH (ref 70–99)
Potassium: 3.4 mmol/L — ABNORMAL LOW (ref 3.5–5.1)
Sodium: 138 mmol/L (ref 135–145)
Total Bilirubin: 0.5 mg/dL (ref 0.3–1.2)
Total Protein: 6.6 g/dL (ref 6.5–8.1)

## 2020-07-22 LAB — LACTIC ACID, PLASMA: Lactic Acid, Venous: 1.8 mmol/L (ref 0.5–1.9)

## 2020-07-22 LAB — CBC
HCT: 35.7 % — ABNORMAL LOW (ref 36.0–46.0)
Hemoglobin: 11.2 g/dL — ABNORMAL LOW (ref 12.0–15.0)
MCH: 27.1 pg (ref 26.0–34.0)
MCHC: 31.4 g/dL (ref 30.0–36.0)
MCV: 86.2 fL (ref 80.0–100.0)
Platelets: 291 10*3/uL (ref 150–400)
RBC: 4.14 MIL/uL (ref 3.87–5.11)
RDW: 14.4 % (ref 11.5–15.5)
WBC: 13.5 10*3/uL — ABNORMAL HIGH (ref 4.0–10.5)
nRBC: 0 % (ref 0.0–0.2)

## 2020-07-22 LAB — CORTISOL-AM, BLOOD: Cortisol - AM: 4.8 ug/dL — ABNORMAL LOW (ref 6.7–22.6)

## 2020-07-22 LAB — HIV ANTIBODY (ROUTINE TESTING W REFLEX): HIV Screen 4th Generation wRfx: NONREACTIVE

## 2020-07-22 LAB — URINE CULTURE: Culture: NO GROWTH

## 2020-07-22 LAB — PROTIME-INR
INR: 1.1 (ref 0.8–1.2)
Prothrombin Time: 13.9 seconds (ref 11.4–15.2)

## 2020-07-22 MED ORDER — POTASSIUM CHLORIDE CRYS ER 20 MEQ PO TBCR
40.0000 meq | EXTENDED_RELEASE_TABLET | Freq: Once | ORAL | Status: AC
  Administered 2020-07-22: 40 meq via ORAL
  Filled 2020-07-22: qty 2

## 2020-07-22 NOTE — Progress Notes (Addendum)
PROGRESS NOTE  Krista Crosby AJG:[id number] DOB: 10-29-1975 DOA: 07/21/2020 PCP: Pcp, No   LOS: 1 day   Brief narrative: As per HPI,  Krista Crosby is a 44 y.o. female with medical history significant of seizure disorder, obstructive sleep apnea, morbid obesity who presented to med [facility] complaining of right lower leg swelling starting as a pimple bump.  It started rapidly in  24 hours and she suspected insect bite.  Patient was seen and evaluated and found to have an abscess in the right lower extremity around the lateral aspect of the leg.  Incision and drainage performed in the ER.  Patient was found to be septic meeting the SIRS criteria plus the infected wound and abscess.  She has had associated fever, malaise loss of appetite and weakness.  Patient was COVID-19 negative and has been vaccinated with the first dose of her Covid vaccine.  Patient was admitted for IV antibiotics and wound care. ED Course: Lactate of 2.8.  Temperature 100.7 blood pressure 150/86 pulse 125 respirate of 36 and oxygen sats 96% on room air.  White count 15.4 hemoglobin 11.6 platelets 309.  Urinalysis was  negative.  Chest x-ray showed no acute findings.  Chemistry was normal.  Patient was admitted with sepsis due to infected right lower extremity wound.  Assessment/Plan:  Principal Problem:   Sepsis (HCC) Active Problems:   Seizure disorder (HCC)   OSA (obstructive sleep apnea) Grade 1 obesity    Abscess of right leg  Sepsis secondary to right lower extremity abscess.  Status post incision and drainage.  Patient received IV fluid bolus including vancomycin and Rocephin.  Blood cultures have been sent and negative in less than 24 hours..  Will follow.  Follow wound culture.   Lactate was elevated initially at 2.8 as improved to 1.8 at this time.  Leukocytosis present.  T max of 100.7 F.  Mild hypokalemia.  Will replace orally.  Check BMP in a.m.  Right lower extremity abscess: Status post  incision and drainage.    Local dressing.  Obstructive sleep apnea: CPAP at night.  History of seizure disorder: Resume continue home medications.  Grade 1 obesity: Dietary counseling.  DVT prophylaxis: enoxaparin (LOVENOX) injection 40 mg Start: 07/21/20 2200   Code Status: Full code  Family Communication: None  Status is: Inpatient  Remains inpatient appropriate because:IV treatments appropriate due to intensity of illness or inability to take PO, Inpatient level of care appropriate due to severity of illness and Sepsis follow-up blood cultures   Dispo: The patient is from: Home              Anticipated d/c is to: Home              Anticipated d/c date is: 1-2 days              Patient currently is not medically stable to d/c.  Consultants:  None  Procedures:  I&D  Antibiotics:  . Rocephin and vancomycin iV  Anti-infectives (From admission, onward)   Start     Dose/Rate Route Frequency Ordered Stop   07/22/20 1400  cefTRIAXone (ROCEPHIN) 2 g in sodium chloride 0.9 % 100 mL IVPB        2 g 200 mL/hr over 30 Minutes Intravenous Every 24 hours 07/21/20 2042     07/22/20 0200  vancomycin (VANCOCIN) IVPB 1000 mg/200 mL premix        1,000 mg 200 mL/hr over 60 Minutes Intravenous Every 12  hours 07/21/20 1340     07/21/20 2130  vancomycin (VANCOCIN) IVPB 1000 mg/200 mL premix  Status:  Discontinued        1,000 mg 200 mL/hr over 60 Minutes Intravenous  Once 07/21/20 2041 07/21/20 2047   07/21/20 1330  vancomycin (VANCOCIN) IVPB 1000 mg/200 mL premix        1,000 mg 200 mL/hr over 60 Minutes Intravenous  Once 07/21/20 1317 07/21/20 1518   07/21/20 1330  cefTRIAXone (ROCEPHIN) 2 g in sodium chloride 0.9 % 100 mL IVPB        2 g 200 mL/hr over 30 Minutes Intravenous  Once 07/21/20 1317 07/21/20 1409      Subjective: Today, patient was seen and examined at bedside.  Today complains of mild pain at the surgical site.  No fever or chills.  No nausea  vomiting.  Objective: Vitals:   07/22/20 0012 07/22/20 0430  BP: 132/66 121/74  Pulse: (!) 106 94  Resp: 20   Temp: 98.6 F (37 C) 97.6 F (36.4 C)  SpO2: 99% 94%    Intake/Output Summary (Last 24 hours) at 07/22/2020 0745 Last data filed at 07/22/2020 0600 Gross per 24 hour  Intake 7569.91 ml  Output 2150 ml  Net 5419.91 ml   Filed Weights   07/21/20 1240  Weight: 95.3 kg   Body mass index is 32.89 kg/m.   Physical Exam: GENERAL: Patient is alert awake and oriented. Not in obvious distress.  On nasal CPAP.  Morbidly obese HENT: No scleral pallor or icterus. Pupils equally reactive to light. Oral mucosa is moist NECK: is supple, no gross swelling noted. CHEST: Clear to auscultation. No crackles or wheezes.  Diminished breath sounds bilaterally. CVS: S1 and S2 heard, no murmur. Regular rate and rhythm.  ABDOMEN: Soft, non-tender, bowel sounds are present. EXTREMITIES: Right leg with abscess status post I&D covered with dressing.  No active drainage at the time of my evaluation.  Mild perilesional cellulitis noted. CNS: Cranial nerves are intact. No focal motor deficits. SKIN: warm , right lower extremity with abscess status post I&D.  Data Review: I have personally reviewed the following laboratory data and studies,  CBC: Recent Labs  Lab 07/21/20 1246 07/21/20 2139 07/22/20 0031  WBC 12.9* 15.4* 13.5*  NEUTROABS 9.9*  --   --   HGB 12.7 11.6* 11.2*  HCT 39.2 36.7 35.7*  MCV 82.2 85.2 86.2  PLT 348 309 291   Basic Metabolic Panel: Recent Labs  Lab 07/21/20 1246 07/21/20 2139 07/22/20 0031  NA 132*  --  138  K 3.3*  --  3.4*  CL 102  --  105  CO2 21*  --  23  GLUCOSE 158*  --  121*  BUN 7  --  <5*  CREATININE 0.74 0.59 0.72  CALCIUM 8.6*  --  8.3*   Liver Function Tests: Recent Labs  Lab 07/21/20 1246 07/22/20 0031  AST 24 14*  ALT 23 18  ALKPHOS 47 43  BILITOT 0.6 0.5  PROT 7.2 6.6  ALBUMIN 3.6 3.0*   No results for input(s): LIPASE,  AMYLASE in the last 168 hours. No results for input(s): AMMONIA in the last 168 hours. Cardiac Enzymes: No results for input(s): CKTOTAL, CKMB, CKMBINDEX, TROPONINI in the last 168 hours. BNP (last 3 results) No results for input(s): BNP in the last 8760 hours.  ProBNP (last 3 results) No results for input(s): PROBNP in the last 8760 hours.  CBG: No results for input(s): GLUCAP in the  last 168 hours. Recent Results (from the past 240 hour(s))  Culture, blood (Routine x 2)     Status: None (Preliminary result)   Collection Time: 07/21/20 12:46 PM   Specimen: Right Antecubital; Blood  Result Value Ref Range Status   Specimen Description   Final    RIGHT ANTECUBITAL Performed at [facility] [place], [address] [place], [place] [address]    Special Requests   Final    BOTTLES DRAWN AEROBIC AND ANAEROBIC Blood Culture results may not be optimal due to an excessive volume of blood received in culture bottles Performed at [facility] [place], [address] [place], [place] [address]    Culture   Final    NO GROWTH < 24 HOURS Performed at [facility], [address] [place], [place] [address]    Report Status PENDING  Incomplete  Culture, blood (Routine x 2)     Status: None (Preliminary result)   Collection Time: 07/21/20  1:15 PM   Specimen: Right Antecubital; Blood  Result Value Ref Range Status   Specimen Description   Final    RIGHT ANTECUBITAL Performed at [facility] [place], [address] [place], [place] [address]    Special Requests   Final    BOTTLES DRAWN AEROBIC AND ANAEROBIC Blood Culture results may not be optimal due to an inadequate volume of blood received in culture bottles Performed at [facility] [place], [address] [place], [place] [address]    Culture   Final    NO GROWTH < 24 HOURS Performed at [facility], [address] [place], [place] [address]    Report Status PENDING  Incomplete  Respiratory Panel by RT  PCR (Flu A&B, Covid) - Nasopharyngeal Swab     Status: None   Collection Time: 07/21/20  1:15 PM   Specimen: Nasopharyngeal Swab  Result Value Ref Range Status   SARS Coronavirus 2 by RT PCR NEGATIVE NEGATIVE Final    Comment: (NOTE) SARS-CoV-2 target nucleic acids are NOT DETECTED.  The SARS-CoV-2 RNA is generally detectable in upper respiratoy specimens during the acute phase of infection. The lowest concentration of SARS-CoV-2 viral copies this assay can detect is 131 copies/mL. A negative result does not preclude SARS-Cov-2 infection and should not be used as the sole basis for treatment or other patient management decisions. A negative result may occur with  improper specimen collection/handling, submission of specimen other than nasopharyngeal swab, presence of viral mutation(s) within the areas targeted by this assay, and inadequate number of viral copies (<131 copies/mL). A negative result must be combined with clinical observations, patient history, and epidemiological information. The expected result is Negative.  Fact Sheet for Patients:  https://www.moore.com/https://www.fda.gov/media/142436/download  Fact Sheet for Healthcare Providers:  https://www.young.biz/https://www.fda.gov/media/142435/download  This test is no t yet approved or cleared by the [place] FDA and  has been authorized for detection and/or diagnosis of SARS-CoV-2 by FDA under an Emergency Use Authorization (EUA). This EUA will remain  in effect (meaning this test can be used) for the duration of the COVID-19 declaration under Section 564(b)(1) of the Act, 21 U.S.C. section 360bbb-3(b)(1), unless the authorization is terminated or revoked sooner.     Influenza A by PCR NEGATIVE NEGATIVE Final   Influenza B by PCR NEGATIVE NEGATIVE Final    Comment: (NOTE) The Xpert Xpress SARS-CoV-2/FLU/RSV assay is intended as an aid in  the diagnosis of influenza from Nasopharyngeal swab specimens  and  should not be used as a sole basis for  treatment. Nasal washings and  aspirates are unacceptable for Xpert Xpress SARS-CoV-2/FLU/RSV  testing.  Fact Sheet for Patients: https://www.moore.com/  Fact Sheet for Healthcare Providers: https://www.young.biz/  This test is not yet approved or cleared by the [place] FDA and  has been authorized for detection and/or diagnosis of SARS-CoV-2 by  FDA under an Emergency Use Authorization (EUA). This EUA will remain  in effect (meaning this test can be used) for the duration of the  Covid-19 declaration under Section 564(b)(1) of the Act, 21  U.S.C. section 360bbb-3(b)(1), unless the authorization is  terminated or revoked. Performed at [facility], [address] [place], [place] [address]   Wound or Superficial Culture     Status: None (Preliminary result)   Collection Time: 07/21/20  2:23 PM   Specimen: Wound; Abscess  Result Value Ref Range Status   Specimen Description WOUND RIGHT LEG  Final   Special Requests LATERAL  Final   Gram Stain   Final    FEW WBC PRESENT, PREDOMINANTLY PMN RARE GRAM POSITIVE COCCI Performed at [facility], [address], [place], [place] [address]    Culture PENDING  Incomplete   Report Status PENDING  Incomplete     Studies: DG Chest Port 1 View  Result Date: 07/21/2020 CLINICAL DATA:  Questionable sepsis. EXAM: PORTABLE CHEST 1 VIEW COMPARISON:  11/11/2019. FINDINGS: Mediastinum and hilar structures normal. Heart size normal. Mild right base atelectasis/infiltrate. No pleural effusion or pneumothorax. IMPRESSION: Mild right base atelectasis/infiltrate. Electronically Signed   By: Maisie Fus  Register   On: 07/21/2020 15:29      Joycelyn Das, MD  [facility] 07/22/2020

## 2020-07-23 DIAGNOSIS — L02415 Cutaneous abscess of right lower limb: Secondary | ICD-10-CM | POA: Diagnosis not present

## 2020-07-23 DIAGNOSIS — G4733 Obstructive sleep apnea (adult) (pediatric): Secondary | ICD-10-CM | POA: Diagnosis not present

## 2020-07-23 DIAGNOSIS — E669 Obesity, unspecified: Secondary | ICD-10-CM

## 2020-07-23 DIAGNOSIS — A419 Sepsis, unspecified organism: Secondary | ICD-10-CM | POA: Diagnosis not present

## 2020-07-23 LAB — CBC
HCT: 37.2 % (ref 36.0–46.0)
Hemoglobin: 11.4 g/dL — ABNORMAL LOW (ref 12.0–15.0)
MCH: 26.5 pg (ref 26.0–34.0)
MCHC: 30.6 g/dL (ref 30.0–36.0)
MCV: 86.5 fL (ref 80.0–100.0)
Platelets: 281 10*3/uL (ref 150–400)
RBC: 4.3 MIL/uL (ref 3.87–5.11)
RDW: 14.3 % (ref 11.5–15.5)
WBC: 7.5 10*3/uL (ref 4.0–10.5)
nRBC: 0 % (ref 0.0–0.2)

## 2020-07-23 LAB — AEROBIC CULTURE W GRAM STAIN (SUPERFICIAL SPECIMEN)

## 2020-07-23 LAB — BASIC METABOLIC PANEL
Anion gap: 9 (ref 5–15)
BUN: 7 mg/dL (ref 6–20)
CO2: 26 mmol/L (ref 22–32)
Calcium: 8.7 mg/dL — ABNORMAL LOW (ref 8.9–10.3)
Chloride: 104 mmol/L (ref 98–111)
Creatinine, Ser: 0.65 mg/dL (ref 0.44–1.00)
GFR, Estimated: >60 mL/min (ref >60)
Glucose, Bld: 100 mg/dL — ABNORMAL HIGH (ref 70–99)
Potassium: 3.7 mmol/L (ref 3.5–5.1)
Sodium: 139 mmol/L (ref 135–145)

## 2020-07-23 MED ORDER — SULFAMETHOXAZOLE-TRIMETHOPRIM 800-160 MG PO TABS: 1.0000 | ORAL_TABLET | Freq: Two times a day (BID) | ORAL | 0 refills | Status: AC

## 2020-07-23 MED ORDER — IBUPROFEN 200 MG PO TABS: 200.0000 mg | ORAL_TABLET | Freq: Four times a day (QID) | ORAL | Status: DC | PRN

## 2020-07-23 MED ORDER — OMEPRAZOLE MAGNESIUM 20 MG PO TBEC: 20.0000 mg | DELAYED_RELEASE_TABLET | Freq: Every day | ORAL | Status: AC

## 2020-07-23 NOTE — Plan of Care (Signed)

## 2020-07-23 NOTE — Discharge Summary (Signed)
Physician Discharge Summary  Governor SpeckingLatanyia Y Crosby WUJ:[id number] DOB: 03/30/1976 DOA: 07/21/2020  PCP: Oneita HurtPcp, No  Admit date: 07/21/2020 Discharge date: 07/23/2020  Admitted From: Home  Discharge disposition: home    Recommendations for Outpatient Follow-Up:   . Follow up with your primary care provider in one week.  . Local dry dressing alternate day  Discharge Diagnosis:   Principal Problem:   Sepsis (HCC) Active Problems:   Seizure disorder (HCC)   OSA (obstructive sleep apnea)   Obesity (BMI 30-39.9)   Abscess of right leg   Discharge Condition: Improved.  Diet recommendation:  Regular.  Wound care: None.  Code status: Full.   History of Present Illness:   Krista NumbersLatanyia Y Brownis a 44 y.o.femalewith medical history significant ofseizure disorder, obstructive sleep apnea, morbid obesity who presented [facility] complaining of right lower leg swelling starting as a pimple bump. It started rapidly in  24 hours and she suspected insect bite. Patient was seen and evaluated and found to have an abscess in the right lower extremity around the lateral aspect of the leg. Incision and drainage performed in the ER. Patient was found to be septic meeting the SIRS criteria plus the infected wound and abscess. She has had associated fever, malaise loss of appetite and weakness. Patient was COVID-19 negative and has been vaccinated with the first dose of her Covid vaccine. Patient was admitted for IV antibiotics and wound care.  In the ED,lactate of 2.8. Temperature 100.7 blood pressure 150/86 pulse 125 respirate of 36 and oxygen sats 96% on room air. White count 15.4 hemoglobin 11.6 platelets 309. Urinalysis was  negative. Chest x-ray showed no acute findings. Chemistry was normal. Patient was admitted with sepsis due to infected right lower extremity wound.   Hospital Course:   Following conditions were addressed during hospitalization as listed below,  Sepsis  secondary to right lower extremity abscess.    Sepsis ruled in.  Status post incision and drainage.  Patient received IV fluid bolus including vancomycin and Rocephin.  Blood cultures were negative in 2 days. Lactate was elevated initially at 2.8 as improved to 1.8 after hydration  Leukocytosis has normalized.  Afebrile. Patient will receive 5 more days of bactrim ds on discharge.   Mild hypokalemia.  replenished.   Right lower extremity abscess:Status post incision and drainage.   Local dressing on discharge.  Obstructive sleep apnea:continue CPAP at night.  History of seizure disorder:Resume continue home medications.  Grade 1 obesity :Dietary counseling.  Disposition.  At this time, patient is stable for disposition home.  She will to follow-up with her primary care physician as outpatient.  Medical Consultants:    None.  Procedures:    Incision and drainage of the leg abscess in the emergency department Subjective:   Today, patient feels ok. No fever chills or rigor. No nausea vomiting.  Denies overt pain.  Discharge Exam:   Vitals:   07/23/20 0449 07/23/20 0911  BP: 121/73 125/80  Pulse: 98 93  Resp: 16 18  Temp: 98.6 F (37 C) 98 F (36.7 C)  SpO2: 98% 98%   Vitals:   07/22/20 2038 07/23/20 0048 07/23/20 0449 07/23/20 0911  BP: 122/75 104/67 121/73 125/80  Pulse: 82 96 98 93  Resp: 16 16 16 18   Temp: 98.4 F (36.9 C) 97.6 F (36.4 C) 98.6 F (37 C) 98 F (36.7 C)  TempSrc: Oral Oral Oral   SpO2: 100% 94% 98% 98%  Weight:  Height:       Body mass index is 32.89 kg/m.  General: Alert awake, not in obvious distress, obese HENT: pupils equally reacting to light,  No scleral pallor or icterus noted. Oral mucosa is moist.  Chest:  Clear breath sounds.  Diminished breath sounds bilaterally. No crackles or wheezes.  CVS: S1 &S2 heard. No murmur.  Regular rate and rhythm. Abdomen: Soft, nontender, nondistended.  Bowel sounds are heard.     Extremities: No cyanosis, clubbing or edema.  Peripheral pulses are palpable.  Right leg with dressing status post I&D. Psych: Alert, awake and oriented, normal mood CNS:  No cranial nerve deficits.  Power equal in all extremities.   Skin: Warm and dry.  No rashes noted.  The results of significant diagnostics from this hospitalization (including imaging, microbiology, ancillary and laboratory) are listed below for reference.     Diagnostic Studies:   DG Chest Port 1 View  Result Date: 07/21/2020 CLINICAL DATA:  Questionable sepsis. EXAM: PORTABLE CHEST 1 VIEW COMPARISON:  11/11/2019. FINDINGS: Mediastinum and hilar structures normal. Heart size normal. Mild right base atelectasis/infiltrate. No pleural effusion or pneumothorax. IMPRESSION: Mild right base atelectasis/infiltrate. Electronically Signed   By: Maisie Fus  Register   On: 07/21/2020 15:29     Labs:   Basic Metabolic Panel: Recent Labs  Lab 07/21/20 1246 07/21/20 1246 07/21/20 2139 07/22/20 0031 07/23/20 0501  NA 132*  --   --  138 139  K 3.3*   < >  --  3.4* 3.7  CL 102  --   --  105 104  CO2 21*  --   --  23 26  GLUCOSE 158*  --   --  121* 100*  BUN 7  --   --  <5* 7  CREATININE 0.74  --  0.59 0.72 0.65  CALCIUM 8.6*  --   --  8.3* 8.7*   < > = values in this interval not displayed.   GFR Estimated Creatinine Clearance: 106.4 mL/min (by C-G formula based on SCr of 0.65 mg/dL). Liver Function Tests: Recent Labs  Lab 07/21/20 1246 07/22/20 0031  AST 24 14*  ALT 23 18  ALKPHOS 47 43  BILITOT 0.6 0.5  PROT 7.2 6.6  ALBUMIN 3.6 3.0*   No results for input(s): LIPASE, AMYLASE in the last 168 hours. No results for input(s): AMMONIA in the last 168 hours. Coagulation profile Recent Labs  Lab 07/21/20 1246 07/22/20 0031  INR 1.0 1.1    CBC: Recent Labs  Lab 07/21/20 1246 07/21/20 2139 07/22/20 0031 07/23/20 0501  WBC 12.9* 15.4* 13.5* 7.5  NEUTROABS 9.9*  --   --   --   HGB 12.7 11.6* 11.2* 11.4*   HCT 39.2 36.7 35.7* 37.2  MCV 82.2 85.2 86.2 86.5  PLT 348 309 291 281   Cardiac Enzymes: No results for input(s): CKTOTAL, CKMB, CKMBINDEX, TROPONINI in the last 168 hours. BNP: Invalid input(s): POCBNP CBG: No results for input(s): GLUCAP in the last 168 hours. D-Dimer No results for input(s): DDIMER in the last 72 hours. Hgb A1c No results for input(s): HGBA1C in the last 72 hours. Lipid Profile No results for input(s): CHOL, HDL, LDLCALC, TRIG, CHOLHDL, LDLDIRECT in the last 72 hours. Thyroid function studies No results for input(s): TSH, T4TOTAL, T3FREE, THYROIDAB in the last 72 hours.  Invalid input(s): FREET3 Anemia work up No results for input(s): VITAMINB12, FOLATE, FERRITIN, TIBC, IRON, RETICCTPCT in the last 72 hours. Microbiology Recent Results (from the past 240  hour(s))  Culture, blood (Routine x 2)     Status: None (Preliminary result)   Collection Time: 07/21/20 12:46 PM   Specimen: Right Antecubital; Blood  Result Value Ref Range Status   Specimen Description   Final    RIGHT ANTECUBITAL Performed at [facility], [address] [place], [place] [address]    Special Requests   Final    BOTTLES DRAWN AEROBIC AND ANAEROBIC Blood Culture results may not be optimal due to an excessive volume of blood received in culture bottles Performed at [facility], [address] [place], [place] [address]    Culture   Final    NO GROWTH 2 DAYS Performed at [facility], [address] [place], [place] [address]    Report Status PENDING  Incomplete  Culture, blood (Routine x 2)     Status: None (Preliminary result)   Collection Time: 07/21/20  1:15 PM   Specimen: Right Antecubital; Blood  Result Value Ref Range Status   Specimen Description   Final    RIGHT ANTECUBITAL Performed at [facility], [address] [place], [place] [address]    Special Requests   Final    BOTTLES DRAWN AEROBIC AND ANAEROBIC Blood Culture  results may not be optimal due to an inadequate volume of blood received in culture bottles Performed at [facility] [address] [place], [place] [address]    Culture   Final    NO GROWTH 2 DAYS Performed at [facility], [address] [place], [place] [address]    Report Status PENDING  Incomplete  Respiratory Panel by RT PCR (Flu A&B, Covid) - Nasopharyngeal Swab     Status: None   Collection Time: 07/21/20  1:15 PM   Specimen: Nasopharyngeal Swab  Result Value Ref Range Status   SARS Coronavirus 2 by RT PCR NEGATIVE NEGATIVE Final    Comment: (NOTE) SARS-CoV-2 target nucleic acids are NOT DETECTED.  The SARS-CoV-2 RNA is generally detectable in upper respiratoy specimens during the acute phase of infection. The lowest concentration of SARS-CoV-2 viral copies this assay can detect is 131 copies/mL. A negative result does not preclude SARS-Cov-2 infection and should not be used as the sole basis for treatment or other patient management decisions. A negative result may occur with  improper specimen collection/handling, submission of specimen other than nasopharyngeal swab, presence of viral mutation(s) within the areas targeted by this assay, and inadequate number of viral copies (<131 copies/mL). A negative result must be combined with clinical observations, patient history, and epidemiological information. The expected result is Negative.  Fact Sheet for Patients:  https://www.moore.com/  Fact Sheet for Healthcare Providers:  https://www.young.biz/  This test is no t yet approved or cleared by the [place] FDA and  has been authorized for detection and/or diagnosis of SARS-CoV-2 by FDA under an Emergency Use Authorization (EUA). This EUA will remain  in effect (meaning this test can be used) for the duration of the COVID-19 declaration under Section 564(b)(1) of the Act, 21 U.S.C. section 360bbb-3(b)(1),  unless the authorization is terminated or revoked sooner.     Influenza A by PCR NEGATIVE NEGATIVE Final   Influenza B by PCR NEGATIVE NEGATIVE Final    Comment: (NOTE) The Xpert Xpress SARS-CoV-2/FLU/RSV assay is intended as an aid in  the diagnosis of influenza from Nasopharyngeal swab specimens and  should not be used as a sole basis  for treatment. Nasal washings and  aspirates are unacceptable for Xpert Xpress SARS-CoV-2/FLU/RSV  testing.  Fact Sheet for Patients: https://www.moore.com/  Fact Sheet for Healthcare Providers: https://www.young.biz/  This test is not yet approved or cleared by the [place] FDA and  has been authorized for detection and/or diagnosis of SARS-CoV-2 by  FDA under an Emergency Use Authorization (EUA). This EUA will remain  in effect (meaning this test can be used) for the duration of the  Covid-19 declaration under Section 564(b)(1) of the Act, 21  U.S.C. section 360bbb-3(b)(1), unless the authorization is  terminated or revoked. Performed at [facility], [address] [place], [place] [address]   Wound or Superficial Culture     Status: None (Preliminary result)   Collection Time: 07/21/20  2:23 PM   Specimen: Wound; Abscess  Result Value Ref Range Status   Specimen Description WOUND RIGHT LEG  Final   Special Requests LATERAL  Final   Gram Stain   Final    FEW WBC PRESENT, PREDOMINANTLY PMN RARE GRAM POSITIVE COCCI    Culture   Final    RARE STAPHYLOCOCCUS AUREUS SUSCEPTIBILITIES TO FOLLOW Performed at [facility], [address] [place], [place] [address]    Report Status PENDING  Incomplete  Urine culture     Status: None   Collection Time: 07/21/20  2:54 PM   Specimen: In/Out Cath Urine  Result Value Ref Range Status   Specimen Description   Final    IN/OUT CATH URINE Performed at [facility], [address] [place], [place] [address]    Special  Requests   Final    NONE Performed at [facility], [address] [place], [place] [address]    Culture   Final    NO GROWTH Performed at [facility], [address] [place], [place] [address]    Report Status 07/22/2020 FINAL  Final     Discharge Instructions:   Discharge Instructions    Call MD for:  redness, tenderness, or signs of infection (pain, swelling, redness, odor or green/yellow discharge around incision site)   Complete by: As directed    Call MD for:  severe uncontrolled pain   Complete by: As directed    Call MD for:  temperature >100.4   Complete by: As directed    Diet - low sodium heart healthy   Complete by: As directed    Discharge instructions   Complete by: As directed    Complete the course of antibiotics. Follow up with your primary care provider in one week.  Dry dressing every other day   Increase activity slowly   Complete by: As directed      Allergies as of 07/23/2020   No Known Allergies     Medication List    STOP taking these medications   amoxicillin-clavulanate 875-125 MG tablet Commonly known as: Augmentin   Guaifenesin 1200 MG Tb12   guaiFENesin-codeine 100-10 MG/5ML syrup Commonly known as: Cheratussin AC   ipratropium 0.06 % nasal spray Commonly known as: Atrovent   lidocaine 5 % Commonly known as: Lidoderm   methocarbamol 500 MG tablet Commonly known as: ROBAXIN   nystatin cream Commonly known as: MYCOSTATIN   phenazopyridine 100 MG tablet Commonly known as: PYRIDIUM   promethazine-dextromethorphan 6.25-15 MG/5ML syrup Commonly known as: PROMETHAZINE-DM   Pseudoephedrine-Guaifenesin 872-103-2814 MG Tb12 Commonly known as: Mucinex D     TAKE these medications  AeroChamber Plus inhaler Use as instructed   albuterol 108 (90 Base) MCG/ACT inhaler Commonly known as: VENTOLIN HFA Inhale 1-2 puffs into the lungs every 6 (six) hours as needed for wheezing or shortness of breath.   ibuprofen 200  MG tablet Commonly known as: Motrin IB Take 1 tablet (200 mg total) by mouth every 6 (six) hours as needed for fever or moderate pain.     omeprazole 20 MG tablet Commonly known as: PriLOSEC OTC Take 1 tablet (20 mg total) by mouth daily.   sulfamethoxazole-trimethoprim 800-160 MG tablet Commonly known as: BACTRIM DS Take 1 tablet by mouth 2 (two) times daily for 5 days.       Follow-up Information    Primary care physician. Schedule an appointment as soon as possible for a visit in 1 week(s).   Why: regular followup               Time coordinating discharge: 39 minutes  Signed:  Elda Dunkerson  [facility] 07/23/2020, 10:30 AM

## 2020-07-24 LAB — CULTURE, BLOOD (ROUTINE X 2)

## 2020-07-26 LAB — CULTURE, BLOOD (ROUTINE X 2)
Culture: NO GROWTH
Culture: NO GROWTH

## 2020-08-07 ENCOUNTER — Emergency Department (HOSPITAL_BASED_OUTPATIENT_CLINIC_OR_DEPARTMENT_OTHER)
Admission: EM | Admit: 2020-08-07 | Discharge: 2020-08-07 | Disposition: A | Discharge status: Home / Self Care | Payer: Medicaid Other | Attending: Emergency Medicine | Admitting: Emergency Medicine

## 2020-08-07 ENCOUNTER — Encounter (HOSPITAL_BASED_OUTPATIENT_CLINIC_OR_DEPARTMENT_OTHER): Payer: Self-pay | Admitting: Emergency Medicine

## 2020-08-07 DIAGNOSIS — Z87891 Personal history of nicotine dependence: Secondary | ICD-10-CM | POA: Diagnosis not present

## 2020-08-07 DIAGNOSIS — R059 Cough, unspecified: Secondary | ICD-10-CM | POA: Diagnosis present

## 2020-08-07 DIAGNOSIS — U071 COVID-19: Secondary | ICD-10-CM | POA: Diagnosis not present

## 2020-08-07 DIAGNOSIS — R519 Headache, unspecified: Secondary | ICD-10-CM

## 2020-08-07 LAB — RESPIRATORY PANEL BY RT PCR (FLU A&B, COVID)
Influenza A by PCR: NEGATIVE
Influenza B by PCR: NEGATIVE
SARS Coronavirus 2 by RT PCR: POSITIVE — AB

## 2020-08-07 MED ORDER — ACETAMINOPHEN 325 MG PO TABS
ORAL_TABLET | ORAL | Status: AC
  Filled 2020-08-07: qty 2

## 2020-08-07 MED ORDER — SODIUM CHLORIDE 0.9 % IV BOLUS
1000.0000 mL | Freq: Once | INTRAVENOUS | Status: AC
  Administered 2020-08-07: 1000 mL via INTRAVENOUS

## 2020-08-07 MED ORDER — METOCLOPRAMIDE HCL 5 MG/ML IJ SOLN
10.0000 mg | Freq: Once | INTRAMUSCULAR | Status: AC
  Administered 2020-08-07: 10 mg via INTRAVENOUS
  Filled 2020-08-07: qty 2

## 2020-08-07 MED ORDER — KETOROLAC TROMETHAMINE 30 MG/ML IJ SOLN
30.0000 mg | Freq: Once | INTRAMUSCULAR | Status: AC
  Administered 2020-08-07: 30 mg via INTRAVENOUS
  Filled 2020-08-07: qty 1

## 2020-08-07 MED ORDER — DEXAMETHASONE SODIUM PHOSPHATE 10 MG/ML IJ SOLN
10.0000 mg | Freq: Once | INTRAMUSCULAR | Status: AC
  Administered 2020-08-07: 10 mg via INTRAVENOUS
  Filled 2020-08-07: qty 1

## 2020-08-07 NOTE — Discharge Instructions (Signed)
You were seen in the emergency department for evaluation of headache and cough.  You tested positive for Covid.  You will need to isolate for up to 14 days from the day your symptoms started.  Drink plenty of fluids, Tylenol and ibuprofen for pain.  I sent a message to the infusion center and they should contact you regarding monoclonal antibody infusion.  Please return to the emergency department if any shortness of breath or other concerning symptoms.

## 2020-08-07 NOTE — ED Provider Notes (Signed)
[facility] EMERGENCY DEPARTMENT Provider Note   CSN: [id number] Arrival date & time: 08/07/20  1655     History Chief Complaint  Patient presents with  . Headache  . Cough    Krista Crosby is a 44 y.o. female.  She is here with a complaint of a frontal headache intermittent 9 out of 10 intensity throbbing for 2 or 3 days.  She started with a dry cough today.  No sick contacts or recent travel.  She is Covid vaccinated.  No fevers.  No blurry vision double vision numbness tingling neck pain.  She tried some BC powders without improvement.  She is Covid vaccinated The history is provided by the patient.  Headache Pain location:  Frontal Quality:  Dull Radiates to:  Does not radiate Severity currently:  9/10 Severity at highest:  9/10 Onset quality:  Gradual Duration:  3 days Timing:  Intermittent Chronicity:  New Relieved by:  Nothing Worsened by:  Nothing Ineffective treatments:  Aspirin Associated symptoms: cough   Associated symptoms: no abdominal pain, no blurred vision, no diarrhea, no dizziness, no fever, no loss of balance, no nausea, no neck pain, no photophobia, no sore throat, no tingling and no vomiting   Cough Cough characteristics:  Non-productive Severity:  Mild Onset quality:  Gradual Duration:  1 day Timing:  Sporadic Progression:  Unchanged Chronicity:  New Context: not sick contacts   Relieved by:  Nothing Worsened by:  Nothing Ineffective treatments:  None tried Associated symptoms: headaches   Associated symptoms: no chest pain, no fever, no rash, no shortness of breath and no sore throat        Past Medical History:  Diagnosis Date  . Seizures [facility])     Patient Active Problem List   Diagnosis Date Noted  . Sepsis (HCC) 07/21/2020  . Seizure disorder (HCC) 07/21/2020  . OSA (obstructive sleep apnea) 07/21/2020  . Obesity (BMI 30-39.9) 07/21/2020  . Abscess of right leg 07/21/2020    Past Surgical History:  Procedure  Laterality Date  . FOOT SURGERY       OB History   No obstetric history on file.     No family history on file.  Social History   Tobacco Use  . Smoking status: Former Games developer  . Smokeless tobacco: Never Used  Substance Use Topics  . Alcohol use: No  . Drug use: No    Home Medications Prior to Admission medications   Medication Sig Start Date End Date Taking? Authorizing Provider  albuterol (PROVENTIL HFA;VENTOLIN HFA) 108 (90 Base) MCG/ACT inhaler Inhale 1-2 puffs into the lungs every 6 (six) hours as needed for wheezing or shortness of breath. Patient not taking: Reported on 07/21/2020 03/31/16   Domenick Gong, MD  ibuprofen (MOTRIN IB) 200 MG tablet Take 1 tablet (200 mg total) by mouth every 6 (six) hours as needed for fever or moderate pain. 07/23/20   Pokhrel, Rebekah [place], MD  omeprazole (PRILOSEC OTC) 20 MG tablet Take 1 tablet (20 mg total) by mouth daily. 07/23/20   Pokhrel, Rebekah [place], MD  Spacer/Aero-Holding Chambers (AEROCHAMBER PLUS) inhaler Use as instructed Patient not taking: Reported on 07/21/2020 03/31/16   Domenick Gong, MD    Allergies    Patient has no known allergies.  Review of Systems   Review of Systems  Constitutional: Negative for fever.  HENT: Negative for sore throat.   Eyes: Negative for blurred vision and photophobia.  Respiratory: Positive for cough. Negative for shortness of breath.   Cardiovascular:  Negative for chest pain.  Gastrointestinal: Negative for abdominal pain, diarrhea, nausea and vomiting.  Genitourinary: Negative for dysuria.  Musculoskeletal: Negative for neck pain.  Skin: Negative for rash.  Neurological: Positive for headaches. Negative for dizziness and loss of balance.    Physical Exam Updated Vital Signs BP (!) 108/94 (BP Location: Right Arm)   Pulse (!) 109   Temp 98.8 F (37.1 C) (Oral)   Resp 18   Ht 5\' 6"  (1.676 m)   Wt 106 kg   SpO2 98%   BMI 37.72 kg/m   Physical Exam Vitals and nursing note reviewed.    Constitutional:      General: She is not in acute distress.    Appearance: Normal appearance. She is well-developed.  HENT:     Head: Normocephalic and atraumatic.     Right Ear: Tympanic membrane normal.     Left Ear: Tympanic membrane normal.     Nose: Nose normal.     Mouth/Throat:     Mouth: Mucous membranes are moist.     Pharynx: Oropharynx is clear.  Eyes:     Conjunctiva/sclera: Conjunctivae normal.  Cardiovascular:     Rate and Rhythm: Normal rate and regular rhythm.     Heart sounds: No murmur heard.   Pulmonary:     Effort: Pulmonary effort is normal. No respiratory distress.     Breath sounds: Normal breath sounds.  Abdominal:     Palpations: Abdomen is soft.     Tenderness: There is no abdominal tenderness.  Musculoskeletal:     Cervical back: Neck supple. No rigidity.  Skin:    General: Skin is warm and dry.  Neurological:     General: No focal deficit present.     Mental Status: She is alert.     GCS: GCS eye subscore is 4. GCS verbal subscore is 5. GCS motor subscore is 6.     Cranial Nerves: No cranial nerve deficit.     Sensory: No sensory deficit.     Motor: No weakness.     ED Results / Procedures / Treatments   Labs (all labs ordered are listed, but only abnormal results are displayed) Labs Reviewed  RESPIRATORY PANEL BY RT PCR (FLU A&B, COVID) - Abnormal; Notable for the following components:      Result Value   SARS Coronavirus 2 by RT PCR POSITIVE (*)    All other components within normal limits    EKG None  Radiology No results found.  Procedures Procedures (including critical care time)  Medications Ordered in ED Medications  ketorolac (TORADOL) 30 MG/ML injection 30 mg (30 mg Intravenous Given 08/07/20 1918)  sodium chloride 0.9 % bolus 1,000 mL ( Intravenous Stopped 08/07/20 2017)  metoCLOPramide (REGLAN) injection 10 mg (10 mg Intravenous Given 08/07/20 1920)  dexamethasone (DECADRON) injection 10 mg (10 mg Intravenous Given  08/07/20 2033)    ED Course  I have reviewed the triage vital signs and the nursing notes.  Pertinent labs & imaging results that were available during my care of the patient were reviewed by me and considered in my medical decision making (see chart for details).  Clinical Course as of Aug 08 1008  10-09-1973 Aug 07, 2020  2027 Patient states headache is much better.  Covid testing coming up positive.  She said she had been vaccinated.  I put in a secure message to the [facility] and updated the patient that they will reach out to her tomorrow.  Given a  dose of Decadron for the headache.  We discussed isolation and return instructions.   [MB]    Clinical Course User Index [MB] Terrilee Files, MD   MDM Rules/Calculators/A&P                         RUCHEL BRANDENBURGER was evaluated in Emergency Department on 08/08/2020 for the symptoms described in the history of present illness. She was evaluated in the context of the global COVID-19 pandemic, which necessitated consideration that the patient might be at risk for infection with the SARS-CoV-2 virus that causes COVID-19. Institutional protocols and algorithms that pertain to the evaluation of patients at risk for COVID-19 are in a state of rapid change based on information released by regulatory bodies including the CDC and federal and state organizations. These policies and algorithms were followed during the patient's care in the ED.  44 year old female here with headache and dry cough.  Differential includes Covid, viral syndrome, migraine, tension headache.  She was given Reglan and Toradol with minimal improvement.  Given dexamethasone with moderate improvement.  Covid testing positive.  Have placed a secure message into the [facility] and they said that they would reach out to the patient tomorrow for infusion.  Reviewed with patient and she is comfortable plan for isolation home and observation of her symptoms.  Return instructions  discussed. Final Clinical Impression(s) / ED Diagnoses Final diagnoses:  Generalized headache  COVID-19 virus infection    Rx / DC Orders ED Discharge Orders    None       Terrilee Files, MD 08/08/20 1010

## 2020-08-07 NOTE — ED Notes (Signed)
Dr. Charm Barges aware of positive covid

## 2020-08-07 NOTE — ED Triage Notes (Addendum)
Headache x 2 days, cough today. Pt fully vaccinated against covid

## 2020-08-08 ENCOUNTER — Encounter: Payer: Self-pay | Admitting: Unknown Physician Specialty

## 2020-08-08 ENCOUNTER — Telehealth: Payer: Self-pay | Admitting: Unknown Physician Specialty

## 2020-08-08 ENCOUNTER — Other Ambulatory Visit: Payer: Self-pay | Admitting: Unknown Physician Specialty

## 2020-08-08 DIAGNOSIS — E669 Obesity, unspecified: Secondary | ICD-10-CM

## 2020-08-08 DIAGNOSIS — U071 COVID-19: Secondary | ICD-10-CM

## 2020-08-08 NOTE — Telephone Encounter (Signed)
I connected by phone with Governor Specking on 08/08/2020 at 8:05 AM to discuss the potential use of a new treatment for mild to moderate COVID-19 viral infection in non-hospitalized patients.  This patient is a 44 y.o. female that meets the FDA criteria for Emergency Use Authorization of COVID monoclonal antibody casirivimab/imdevimab or bamlanivimab/eteseviamb.  Has a (+) direct SARS-CoV-2 viral test result  Has mild or moderate COVID-19   Is NOT hospitalized due to COVID-19  Is within 10 days of symptom onset  Has at least one of the high risk factor(s) for progression to severe COVID-19 and/or hospitalization as defined in EUA.  Specific high risk criteria : BMI > 25   I have spoken and communicated the following to the patient or parent/caregiver regarding COVID monoclonal antibody treatment:  1. FDA has authorized the emergency use for the treatment of mild to moderate COVID-19 in adults and pediatric patients with positive results of direct SARS-CoV-2 viral testing who are 39 years of age and older weighing at least 40 kg, and who are at high risk for progressing to severe COVID-19 and/or hospitalization.  2. The significant known and potential risks and benefits of COVID monoclonal antibody, and the extent to which such potential risks and benefits are unknown.  3. Information on available alternative treatments and the risks and benefits of those alternatives, including clinical trials.  4. Patients treated with COVID monoclonal antibody should continue to self-isolate and use infection control measures (e.g., wear mask, isolate, social distance, avoid sharing personal items, clean and disinfect "high touch" surfaces, and frequent handwashing) according to CDC guidelines.   5. The patient or parent/caregiver has the option to accept or refuse COVID monoclonal antibody treatment.  After reviewing this information with the patient, the patient has agreed to receive one of the  available covid 19 monoclonal antibodies and will be provided an appropriate fact sheet prior to infusion. Gabriel Cirri, NP 08/08/2020 8:05 AM  Sx onset 10/22

## 2020-08-09 ENCOUNTER — Ambulatory Visit (HOSPITAL_COMMUNITY)
Admission: RE | Admit: 2020-08-09 | Discharge: 2020-08-09 | Disposition: A | Discharge status: Home / Self Care | Payer: Medicaid Other | Source: Ambulatory Visit | Attending: Pulmonary Disease | Admitting: Pulmonary Disease

## 2020-08-09 DIAGNOSIS — E669 Obesity, unspecified: Secondary | ICD-10-CM | POA: Insufficient documentation

## 2020-08-09 DIAGNOSIS — U071 COVID-19: Secondary | ICD-10-CM | POA: Insufficient documentation

## 2020-08-09 MED ORDER — ALBUTEROL SULFATE HFA 108 (90 BASE) MCG/ACT IN AERS: 2.0000 | INHALATION_SPRAY | Freq: Once | RESPIRATORY_TRACT | Status: DC | PRN

## 2020-08-09 MED ORDER — METHYLPREDNISOLONE SODIUM SUCC 125 MG IJ SOLR: 125.0000 mg | Freq: Once | INTRAMUSCULAR | Status: DC | PRN

## 2020-08-09 MED ORDER — EPINEPHRINE 0.3 MG/0.3ML IJ SOAJ: 0.3000 mg | Freq: Once | INTRAMUSCULAR | Status: DC | PRN

## 2020-08-09 MED ORDER — FAMOTIDINE IN NACL 20-0.9 MG/50ML-% IV SOLN: 20.0000 mg | Freq: Once | INTRAVENOUS | Status: DC | PRN

## 2020-08-09 MED ORDER — SODIUM CHLORIDE 0.9 % IV SOLN: INTRAVENOUS | Status: DC | PRN

## 2020-08-09 MED ORDER — SODIUM CHLORIDE 0.9 % IV SOLN: Freq: Once | INTRAVENOUS | Status: AC

## 2020-08-09 MED ORDER — DIPHENHYDRAMINE HCL 50 MG/ML IJ SOLN: 50.0000 mg | Freq: Once | INTRAMUSCULAR | Status: DC | PRN

## 2020-08-09 NOTE — Discharge Instructions (Signed)

## 2020-08-09 NOTE — Progress Notes (Signed)
°  Diagnosis: COVID-19 ° °Physician:  Patrick Wright ° °Procedure: [facility] Med: bamlanivimab\etesevimab infusion - Provided patient with bamlanimivab\etesevimab fact sheet for patients, parents and caregivers prior to infusion. ° °Complications: No immediate complications noted. ° °Discharge: Discharged home  ° °Krista Crosby °08/09/2020 ° °

## 2020-10-10 ENCOUNTER — Emergency Department (HOSPITAL_BASED_OUTPATIENT_CLINIC_OR_DEPARTMENT_OTHER)
Admission: EM | Admit: 2020-10-10 | Discharge: 2020-10-10 | Disposition: A | Discharge status: Home / Self Care | Payer: Medicaid Other | Attending: Emergency Medicine | Admitting: Emergency Medicine

## 2020-10-10 ENCOUNTER — Encounter (HOSPITAL_BASED_OUTPATIENT_CLINIC_OR_DEPARTMENT_OTHER): Payer: Self-pay

## 2020-10-10 ENCOUNTER — Other Ambulatory Visit: Payer: Self-pay

## 2020-10-10 DIAGNOSIS — L02415 Cutaneous abscess of right lower limb: Secondary | ICD-10-CM | POA: Diagnosis not present

## 2020-10-10 DIAGNOSIS — L0291 Cutaneous abscess, unspecified: Secondary | ICD-10-CM

## 2020-10-10 DIAGNOSIS — R2241 Localized swelling, mass and lump, right lower limb: Secondary | ICD-10-CM | POA: Diagnosis present

## 2020-10-10 DIAGNOSIS — Z87891 Personal history of nicotine dependence: Secondary | ICD-10-CM | POA: Insufficient documentation

## 2020-10-10 MED ORDER — LIDOCAINE HCL (PF) 1 % IJ SOLN
5.0000 mL | Freq: Once | INTRAMUSCULAR | Status: AC
  Administered 2020-10-10: 08:00:00 5 mL
  Filled 2020-10-10: qty 5

## 2020-10-10 MED ORDER — DOXYCYCLINE HYCLATE 100 MG PO CAPS: 100.0000 mg | ORAL_CAPSULE | Freq: Two times a day (BID) | ORAL | 0 refills | Status: AC

## 2020-10-10 NOTE — ED Provider Notes (Signed)
[facility] EMERGENCY DEPARTMENT Provider Note   CSN: [id number] Arrival date & time: 10/10/20  2694     History Chief Complaint  Patient presents with  . Abscess    Krista Crosby is a 44 y.o. female.    Presents to ER with concern for abscess.  Patient reports over the last couple days she has noted a couple small areas of possible abscess on her right lower leg.  States similar to abscess that happened in October which required drainage.  Area is slightly painful, raised, small area of redness.  No fevers.  Otherwise feels well.  Has not taken any pain medication and has not tried any warm compresses yet.  Denies any chronic medical problems.  HPI     Past Medical History:  Diagnosis Date  . Seizures [facility])     Patient Active Problem List   Diagnosis Date Noted  . Sepsis (HCC) 07/21/2020  . Seizure disorder (HCC) 07/21/2020  . OSA (obstructive sleep apnea) 07/21/2020  . Obesity (BMI 30-39.9) 07/21/2020  . Abscess of right leg 07/21/2020    Past Surgical History:  Procedure Laterality Date  . FOOT SURGERY       OB History   No obstetric history on file.     No family history on file.  Social History   Tobacco Use  . Smoking status: Former Games developer  . Smokeless tobacco: Never Used  Substance Use Topics  . Alcohol use: No  . Drug use: No    Home Medications Prior to Admission medications   Medication Sig Start Date End Date Taking? Authorizing Provider  albuterol (PROVENTIL HFA;VENTOLIN HFA) 108 (90 Base) MCG/ACT inhaler Inhale 1-2 puffs into the lungs every 6 (six) hours as needed for wheezing or shortness of breath. Patient not taking: Reported on 07/21/2020 03/31/16   Domenick Gong, MD  doxycycline (VIBRAMYCIN) 100 MG capsule Take 1 capsule (100 mg total) by mouth 2 (two) times daily for 7 days. 10/10/20 10/17/20  Milagros Loll, MD  ibuprofen (MOTRIN IB) 200 MG tablet Take 1 tablet (200 mg total) by mouth every 6 (six) hours as needed  for fever or moderate pain. 07/23/20   Pokhrel, Rebekah Chesterfield, MD  omeprazole (PRILOSEC OTC) 20 MG tablet Take 1 tablet (20 mg total) by mouth daily. 07/23/20   Pokhrel, Rebekah Chesterfield, MD  Spacer/Aero-Holding Chambers (AEROCHAMBER PLUS) inhaler Use as instructed Patient not taking: Reported on 07/21/2020 03/31/16   Domenick Gong, MD    Allergies    Patient has no known allergies.  Review of Systems   Review of Systems  Constitutional: Negative for chills and fever.  HENT: Negative for ear pain and sore throat.   Eyes: Negative for pain and visual disturbance.  Respiratory: Negative for cough and shortness of breath.   Cardiovascular: Negative for chest pain and palpitations.  Gastrointestinal: Negative for abdominal pain and vomiting.  Genitourinary: Negative for dysuria and hematuria.  Musculoskeletal: Negative for arthralgias and back pain.  Skin: Positive for wound. Negative for color change and rash.  Neurological: Negative for seizures and syncope.  All other systems reviewed and are negative.   Physical Exam Updated Vital Signs BP 105/84 (BP Location: Right Arm)   Pulse 72   Temp 98.2 F (36.8 C) (Oral)   Resp 18   Ht 5\' 7"  (1.702 m)   Wt 111.7 kg   LMP 10/03/2020   SpO2 98%   BMI 38.56 kg/m   Physical Exam Vitals and nursing note reviewed.  Constitutional:  General: She is not in acute distress.    Appearance: She is well-developed and well-nourished.  HENT:     Head: Normocephalic and atraumatic.  Eyes:     Conjunctiva/sclera: Conjunctivae normal.  Cardiovascular:     Rate and Rhythm: Normal rate.     Pulses: Normal pulses.  Pulmonary:     Effort: Pulmonary effort is normal. No respiratory distress.  Musculoskeletal:        General: No deformity, signs of injury or edema.     Cervical back: Neck supple.  Skin:    General: Skin is warm and dry.     Comments: 2 cm diameter mildly raised area of induration over the right lateral mid lower leg, slight erythema  overlying, additional 5 mm area of swelling, induration with spontaneous drainage of small purulence over anterior lower leg  Neurological:     Mental Status: She is alert.  Psychiatric:        Mood and Affect: Mood and affect normal.     ED Results / Procedures / Treatments   Labs (all labs ordered are listed, but only abnormal results are displayed) Labs Reviewed - No data to display  EKG None  Radiology No results found.  Procedures .Marland KitchenIncision and Drainage  Date/Time: 10/10/2020 11:07 AM Performed by: Milagros Loll, MD Authorized by: Milagros Loll, MD   Consent:    Consent obtained:  Verbal   Consent given by:  Patient   Risks, benefits, and alternatives were discussed: yes     Risks discussed:  Damage to other organs and bleeding   Alternatives discussed:  No treatment Universal protocol:    Patient identity confirmed:  Verbally with patient Location:    Type:  Abscess   Size:  2   Location:  Lower extremity   Lower extremity location:  Leg   Leg location:  R lower leg Pre-procedure details:    Skin preparation:  Povidone-iodine Sedation:    Sedation type:  None Anesthesia:    Anesthesia method:  Local infiltration   Local anesthetic:  Lidocaine 1% w/o epi Procedure type:    Complexity:  Simple Procedure details:    Incision types:  Single straight   Wound management:  Probed and deloculated   Drainage:  Purulent   Drainage amount:  Moderate   Wound treatment:  Wound left open   Packing materials:  None Post-procedure details:    Procedure completion:  Tolerated well, no immediate complications   (including critical care time)  Medications Ordered in ED Medications  lidocaine (PF) (XYLOCAINE) 1 % injection 5 mL (5 mLs Infiltration Given by Other 10/10/20 8413)    ED Course  I have reviewed the triage vital signs and the nursing notes.  Pertinent labs & imaging results that were available during my care of the patient were reviewed by  me and considered in my medical decision making (see chart for details).    MDM Rules/Calculators/A&P                         44 year old lady presenting to ER with concern for abscess on leg.  On exam, noted 1 small abscess as well as one punctate area of small purulence that was draining spontaneously.  Completed I&D, tolerated well.  There is slight erythema overlying.  No generalized symptoms and afebrile.  Will start on antibiotics, recommended follow-up with PCP or return to ER in 48 hours for close recheck.  Recommended warm compresses.  After the discussed management above, the patient was determined to be safe for discharge.  The patient was in agreement with this plan and all questions regarding their care were answered.  ED return precautions were discussed and the patient will return to the ED with any significant worsening of condition.    Final Clinical Impression(s) / ED Diagnoses Final diagnoses:  Abscess    Rx / DC Orders ED Discharge Orders         Ordered    doxycycline (VIBRAMYCIN) 100 MG capsule  2 times daily        10/10/20 0846           Milagros Loll, MD 10/10/20 [phone]

## 2020-10-10 NOTE — ED Triage Notes (Signed)
Pt arrives with reports of two bumps on her RLE X2 days states that she one back in October that had to be drained.

## 2020-10-10 NOTE — Discharge Instructions (Signed)
Please plan to return to ER in 48 hours for wound recheck.  If any interim, you develop worsening swelling, redness, fever, return to ER for reassessment.  Take antibiotic as prescribed.  Use warm compresses as discussed 3-4 times daily for the next few days.

## 2021-03-31 ENCOUNTER — Emergency Department (HOSPITAL_BASED_OUTPATIENT_CLINIC_OR_DEPARTMENT_OTHER)
Admission: EM | Admit: 2021-03-31 | Discharge: 2021-03-31 | Disposition: A | Discharge status: Home / Self Care | Payer: BC Managed Care – PPO | Attending: Emergency Medicine | Admitting: Emergency Medicine

## 2021-03-31 ENCOUNTER — Encounter (HOSPITAL_BASED_OUTPATIENT_CLINIC_OR_DEPARTMENT_OTHER): Payer: Self-pay

## 2021-03-31 ENCOUNTER — Other Ambulatory Visit: Payer: Self-pay

## 2021-03-31 DIAGNOSIS — X500XXA Overexertion from strenuous movement or load, initial encounter: Secondary | ICD-10-CM | POA: Insufficient documentation

## 2021-03-31 DIAGNOSIS — Y990 Civilian activity done for income or pay: Secondary | ICD-10-CM | POA: Insufficient documentation

## 2021-03-31 DIAGNOSIS — M545 Low back pain, unspecified: Secondary | ICD-10-CM | POA: Diagnosis not present

## 2021-03-31 DIAGNOSIS — Z87891 Personal history of nicotine dependence: Secondary | ICD-10-CM | POA: Diagnosis not present

## 2021-03-31 DIAGNOSIS — M549 Dorsalgia, unspecified: Secondary | ICD-10-CM | POA: Diagnosis present

## 2021-03-31 LAB — URINALYSIS, ROUTINE W REFLEX MICROSCOPIC
Bilirubin Urine: NEGATIVE
Glucose, UA: NEGATIVE mg/dL
Hgb urine dipstick: NEGATIVE
Ketones, ur: NEGATIVE mg/dL
Leukocytes,Ua: NEGATIVE
Nitrite: NEGATIVE
Protein, ur: NEGATIVE mg/dL
Specific Gravity, Urine: <1.005 — ABNORMAL LOW (ref 1.005–1.030)
pH: 6 (ref 5.0–8.0)

## 2021-03-31 MED ORDER — KETOROLAC TROMETHAMINE 30 MG/ML IJ SOLN
15.0000 mg | Freq: Once | INTRAMUSCULAR | Status: AC
  Administered 2021-03-31: 15 mg via INTRAMUSCULAR
  Filled 2021-03-31: qty 1

## 2021-03-31 MED ORDER — METHOCARBAMOL 500 MG PO TABS
500.0000 mg | ORAL_TABLET | Freq: Once | ORAL | Status: AC
  Administered 2021-03-31: 500 mg via ORAL
  Filled 2021-03-31: qty 1

## 2021-03-31 MED ORDER — METHOCARBAMOL 500 MG PO TABS: 500.0000 mg | ORAL_TABLET | Freq: Two times a day (BID) | ORAL | 0 refills | Status: AC

## 2021-03-31 MED ORDER — DICLOFENAC SODIUM 1 % EX GEL: 4.0000 g | Freq: Four times a day (QID) | CUTANEOUS | 0 refills | Status: AC

## 2021-03-31 NOTE — Discharge Instructions (Addendum)
Your story and physical exam is concerning for a muscle strain.  Sometimes a slipped/protruding disc in your back and can cause similar symptoms.  Ultimately this is generally a self resolving issue.  It is important to take Tylenol and ibuprofen to help take down the inflammation in this area and this should improve symptoms over time.  I also prescribed a muscle relaxer for pain.  Please use Tylenol or ibuprofen for pain.  You may use 600 mg ibuprofen every 6 hours or 1000 mg of Tylenol every 6 hours.  You may choose to alternate between the 2.  This would be most effective.  Not to exceed 4 g of Tylenol within 24 hours.  Not to exceed 3200 mg ibuprofen 24 hours.    Please follow-up with your primary care doctor.  Return to the ER for any new or concerning symptoms.  If you do not have a primary care doctor have given you the information for the [facility] on [address].

## 2021-03-31 NOTE — ED Provider Notes (Signed)
[facility] EMERGENCY DEPARTMENT Provider Note   CSN: [id number] Arrival date & time: 03/31/21  1451     History Chief Complaint  Patient presents with   Back Pain    Krista Crosby is a 45 y.o. female.  HPI Patient is a 45 year old female presenting today with back pain that she states began 8 days ago.  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  She states that she was at work and felt that her back was more achy than usual.  She states that she got home and that her pain continued.  She used some icy hot patches on her back and states that this improved her pain but did not make it go away completely.   History without symptoms of urinary or stool retention or incontinence, neurologic changes such as sensation change or weakness lower extremities, coagulopathy or blood thinner use, is not elderly or with history of osteoporosis, denies any history of cancer, fever, IV drug use, weight changes (unexplained), or prolonged steroid use.     Past Medical History:  Diagnosis Date   Seizures [facility])     Patient Active Problem List   Diagnosis Date Noted   Sepsis (HCC) 07/21/2020   Seizure disorder (HCC) 07/21/2020   OSA (obstructive sleep apnea) 07/21/2020   Obesity (BMI 30-39.9) 07/21/2020   Abscess of right leg 07/21/2020    Past Surgical History:  Procedure Laterality Date   FOOT SURGERY       OB History   No obstetric history on file.     No family history on file.  Social History   Tobacco Use   Smoking status: Former    Pack years: 0.00   Smokeless tobacco: Never  Substance Use Topics   Alcohol use: No   Drug use: No    Home Medications Prior to Admission medications   Medication Sig Start Date End Date Taking? Authorizing Provider  diclofenac Sodium (VOLTAREN) 1 % GEL Apply 4 g topically 4 (four)  times daily. 03/31/21  Yes Kimaya Whitlatch S, PA  methocarbamol (ROBAXIN) 500 MG tablet Take 1 tablet (500 mg total) by mouth 2 (two) times daily. 03/31/21  Yes Maleny Candy S, PA  albuterol (PROVENTIL HFA;VENTOLIN HFA) 108 (90 Base) MCG/ACT inhaler Inhale 1-2 puffs into the lungs every 6 (six) hours as needed for wheezing or shortness of breath. Patient not taking: Reported on 07/21/2020 03/31/16   Domenick Gong, MD  ibuprofen (MOTRIN IB) 200 MG tablet Take 1 tablet (200 mg total) by mouth every 6 (six) hours as needed for fever or moderate pain. 07/23/20   Pokhrel, Rebekah Chesterfield, MD  omeprazole (PRILOSEC OTC) 20 MG tablet Take 1 tablet (20 mg total) by mouth daily. 07/23/20   Pokhrel, Rebekah Chesterfield, MD  Spacer/Aero-Holding Chambers (AEROCHAMBER PLUS) inhaler Use as instructed Patient not taking: Reported on 07/21/2020 03/31/16   Domenick Gong, MD    Allergies    Patient has no known allergies.  Review of Systems   Review of Systems  Constitutional:  Negative for fever.  HENT:  Negative for congestion.   Respiratory:  Negative for shortness of breath.   Cardiovascular:  Negative for chest pain.  Gastrointestinal:  Negative for abdominal distention.  Musculoskeletal:  Positive for back pain.  Neurological:  Negative for dizziness and headaches.   Physical Exam Updated Vital  Signs BP 138/84 (BP Location: Left Arm)   Pulse (!) 105   Temp 98.4 F (36.9 C) (Oral)   Resp 20   Ht 5\' 7"  (1.702 m)   Wt 106.1 kg   LMP 03/22/2021   SpO2 100%   BMI 36.65 kg/m   Physical Exam Vitals and nursing note reviewed.  Constitutional:      General: She is not in acute distress. HENT:     Head: Normocephalic and atraumatic.     Nose: Nose normal.  Eyes:     General: No scleral icterus. Cardiovascular:     Rate and Rhythm: Normal rate and regular rhythm.     Pulses: Normal pulses.     Heart sounds: Normal heart sounds.  Pulmonary:     Effort: Pulmonary effort is normal. No respiratory distress.      Breath sounds: No wheezing.  Abdominal:     Palpations: Abdomen is soft.     Tenderness: There is no abdominal tenderness. There is no guarding or rebound.  Musculoskeletal:     Cervical back: Normal range of motion.     Right lower leg: No edema.     Left lower leg: No edema.     Comments: Diffuse paralumbar/paravertebral muscular tenderness left worse than right.  Skin:    General: Skin is warm and dry.     Capillary Refill: Capillary refill takes less than 2 seconds.  Neurological:     Mental Status: She is alert. Mental status is at baseline.     Comments: Moves all 4 extremities.  Sensation intact in bilateral lower extremities.  Psychiatric:        Mood and Affect: Mood normal.        Behavior: Behavior normal.    ED Results / Procedures / Treatments   Labs (all labs ordered are listed, but only abnormal results are displayed) Labs Reviewed  URINALYSIS, ROUTINE W REFLEX MICROSCOPIC - Abnormal; Notable for the following components:      Result Value   Specific Gravity, Urine <1.005 (*)    All other components within normal limits    EKG None  Radiology No results found.  Procedures Procedures   Medications Ordered in ED Medications  ketorolac (TORADOL) 30 MG/ML injection 15 mg (15 mg Intramuscular Given 03/31/21 1547)  methocarbamol (ROBAXIN) tablet 500 mg (500 mg Oral Given 03/31/21 1547)    ED Course  I have reviewed the triage vital signs and the nursing notes.  Pertinent labs & imaging results that were available during my care of the patient were reviewed by me and considered in my medical decision making (see chart for details).    MDM Rules/Calculators/A&P                          Patient is a 45 year old female presenting today with low back pain is been ongoing for 8 days.  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  She states that she has had some relief with icy hot but her pain continues.  Has taken no other  medications.  Broad differential for back pain considered includes malignancy, disc herniation, spinal epidural abscess, spinal fracture, cauda equina, pyelonephritis, kidney stone, AAA, AD, pancreatitis, PE and PTX.   History without symptoms of urinary or stool retention or incontinence, neurologic changes such as sensation change or weakness lower extremities, coagulopathy or blood thinner use, is not elderly or with history of osteoporosis, denies any history  of cancer, fever, IV drug use, weight changes (unexplained), or prolonged steroid use.   Physical exam most consistent with muscular strain. Doubt cauda equina or disc herniation d/t lack of saddle anesthesia/bowel or bladder incontinence or urinary retention, normal gait and reassuring physical examination without neurologic deficits.   History is not supportive of kidney stone, AAA, AD, pancreatitis, PE or PTX. Patient has no CVA tenderness or urinary sx to suggest pyelonephritis or kidney stone.   Will manage patient conservatively at this time. NSAIDs, back exercises/stretches, heat therapy and follow up with PCP if symptoms do not resolve in 3-4 weeks. Patient offered muscle relaxer for comfort at night. Counseled on need to return to ED for fever, worsening or concerning symptoms. Patient agreeable to plan and states understanding of follow up plans and return precautions.    Vitals WNL at time of discharge and patient is no acute distress --heart rate on my examination is 85.  Offered Toradol shot which patient accepted.  Also given 1 dose of Robaxin.  Tylenol ibuprofen recommendations given Voltaren gel and Robaxin prescribed.   Final Clinical Impression(s) / ED Diagnoses Final diagnoses:  Acute low back pain without sciatica, unspecified back pain laterality    Rx / DC Orders ED Discharge Orders          Ordered    methocarbamol (ROBAXIN) 500 MG tablet  2 times daily        03/31/21 1614    diclofenac Sodium (VOLTAREN)  1 % GEL  4 times daily        03/31/21 1615             Solon [place]a [place], [place] 03/31/21 1621    Terald Sleeper, MD 04/01/21 1055

## 2021-03-31 NOTE — ED Triage Notes (Signed)
Pt c/o pain to entire back x 2 days-denies injury-NAD-slow gait

## 2021-04-14 ENCOUNTER — Other Ambulatory Visit: Payer: Self-pay

## 2021-04-14 ENCOUNTER — Emergency Department (HOSPITAL_BASED_OUTPATIENT_CLINIC_OR_DEPARTMENT_OTHER)
Admission: EM | Admit: 2021-04-14 | Discharge: 2021-04-14 | Disposition: A | Discharge status: Home / Self Care | Payer: BC Managed Care – PPO | Attending: Emergency Medicine | Admitting: Emergency Medicine

## 2021-04-14 ENCOUNTER — Encounter (HOSPITAL_BASED_OUTPATIENT_CLINIC_OR_DEPARTMENT_OTHER): Payer: Self-pay

## 2021-04-14 DIAGNOSIS — R000 Tachycardia, unspecified: Secondary | ICD-10-CM | POA: Diagnosis not present

## 2021-04-14 DIAGNOSIS — L02415 Cutaneous abscess of right lower limb: Secondary | ICD-10-CM | POA: Diagnosis present

## 2021-04-14 DIAGNOSIS — Z87891 Personal history of nicotine dependence: Secondary | ICD-10-CM | POA: Diagnosis not present

## 2021-04-14 DIAGNOSIS — L0291 Cutaneous abscess, unspecified: Secondary | ICD-10-CM

## 2021-04-14 MED ORDER — DOXYCYCLINE HYCLATE 100 MG PO CAPS: 100.0000 mg | ORAL_CAPSULE | Freq: Two times a day (BID) | ORAL | 0 refills | Status: AC

## 2021-04-14 MED ORDER — FLUCONAZOLE 150 MG PO TABS: 150.0000 mg | ORAL_TABLET | Freq: Every day | ORAL | 0 refills | Status: AC

## 2021-04-14 MED ORDER — IBUPROFEN 400 MG PO TABS
600.0000 mg | ORAL_TABLET | Freq: Once | ORAL | Status: AC
  Administered 2021-04-14: 600 mg via ORAL
  Filled 2021-04-14: qty 1

## 2021-04-14 MED ORDER — LIDOCAINE HCL (PF) 1 % IJ SOLN
5.0000 mL | Freq: Once | INTRAMUSCULAR | Status: DC
  Filled 2021-04-14: qty 5

## 2021-04-14 NOTE — ED Provider Notes (Signed)
[facility] EMERGENCY DEPARTMENT Provider Note   CSN: [id number] Arrival date & time: 04/14/21  3557     History Chief Complaint  Patient presents with   Abscess    Krista Crosby is a 45 y.o. female.  HPI  Patient with significant medical history of seizure disorders abscesses presents with chief complaint of abscess on her right lower shin.  Patient states she notices that it stared approximately 3 days ago, it has gotten larger in size, has increase in pain, has became more red around the area.  She noted that she has some drainage coming from it earlier today, she states it hurts when she ambulates, denies paresthesia or weakness in lower extremities, denies systemic infection fevers or chills.  Patient is not immunocompromise, nondiabetic, she is not anticoagulated, up-to-date on her tetanus shot.  Patient states she has had in the past but had to be drained.  She denies any alleviating factors.  She does not endorse chest pain, shortness of breath, abdominal pain.  Past Medical History:  Diagnosis Date   Seizures [facility])     Patient Active Problem List   Diagnosis Date Noted   Sepsis (HCC) 07/21/2020   Seizure disorder (HCC) 07/21/2020   OSA (obstructive sleep apnea) 07/21/2020   Obesity (BMI 30-39.9) 07/21/2020   Abscess of right leg 07/21/2020    Past Surgical History:  Procedure Laterality Date   FOOT SURGERY       OB History   No obstetric history on file.     History reviewed. No pertinent family history.  Social History   Tobacco Use   Smoking status: Former    Pack years: 0.00   Smokeless tobacco: Never  Substance Use Topics   Alcohol use: Yes    Comment: socially   Drug use: No    Home Medications Prior to Admission medications   Medication Sig Start Date End Date Taking? Authorizing Provider  doxycycline (VIBRAMYCIN) 100 MG capsule Take 1 capsule (100 mg total) by mouth 2 (two) times daily for 7 days. 04/14/21 04/21/21 Yes Carroll Sage, PA-C  albuterol (PROVENTIL HFA;VENTOLIN HFA) 108 (90 Base) MCG/ACT inhaler Inhale 1-2 puffs into the lungs every 6 (six) hours as needed for wheezing or shortness of breath. Patient not taking: Reported on 07/21/2020 03/31/16   Domenick Gong, MD  diclofenac Sodium (VOLTAREN) 1 % GEL Apply 4 g topically 4 (four) times daily. 03/31/21   Gailen Shelter, PA  ibuprofen (MOTRIN IB) 200 MG tablet Take 1 tablet (200 mg total) by mouth every 6 (six) hours as needed for fever or moderate pain. 07/23/20   Pokhrel, Rebekah [place], MD  methocarbamol (ROBAXIN) 500 MG tablet Take 1 tablet (500 mg total) by mouth 2 (two) times daily. 03/31/21   Gailen Shelter, PA  omeprazole (PRILOSEC OTC) 20 MG tablet Take 1 tablet (20 mg total) by mouth daily. 07/23/20   Pokhrel, Rebekah Chesterfield, MD  Spacer/Aero-Holding Chambers (AEROCHAMBER PLUS) inhaler Use as instructed Patient not taking: Reported on 07/21/2020 03/31/16   Domenick Gong, MD    Allergies    Patient has no known allergies.  Review of Systems   Review of Systems  Constitutional:  Negative for chills and fever.  HENT:  Negative for congestion.   Respiratory:  Negative for shortness of breath.   Cardiovascular:  Negative for chest pain.  Gastrointestinal:  Negative for abdominal pain.  Genitourinary:  Negative for enuresis.  Musculoskeletal:  Negative for back pain.  Skin:  Positive for wound.  Negative for rash.  Neurological:  Negative for dizziness.  Hematological:  Does not bruise/bleed easily.   Physical Exam Updated Vital Signs BP 119/71 (BP Location: Right Arm)   Pulse (!) 108   Temp 98.9 F (37.2 C) (Oral)   Resp 18   Ht 5\' 7"  (1.702 m)   Wt 102.5 kg   LMP 04/13/2021   SpO2 95%   BMI 35.40 kg/m   Physical Exam Vitals and nursing note reviewed.  Constitutional:      General: She is not in acute distress.    Appearance: Normal appearance. She is not ill-appearing or diaphoretic.  HENT:     Head: Normocephalic and atraumatic.      Nose: No congestion or rhinorrhea.  Eyes:     Conjunctiva/sclera: Conjunctivae normal.  Cardiovascular:     Rate and Rhythm: Regular rhythm. Tachycardia present.  Pulmonary:     Effort: Pulmonary effort is normal.  Musculoskeletal:     Cervical back: Neck supple.     Right lower leg: No edema.     Left lower leg: No edema.  Skin:    General: Skin is warm and dry.     Coloration: Skin is not jaundiced or pale.     Comments: Patient has a large pustule on the distal aspect of her right shin, measuring approximately 2 cm in diameter, surrounding erythema, slight purulent drainage, area is warm to the touch, tender to palpation, fluctuant indurations present.  Neurological:     Mental Status: She is alert.  Psychiatric:        Mood and Affect: Mood normal.    ED Results / Procedures / Treatments   Labs (all labs ordered are listed, but only abnormal results are displayed) Labs Reviewed - No data to display  EKG None  Radiology No results found.  Procedures .07/02/2022Incision and Drainage  Date/Time: 04/14/2021 11:49 AM Performed by: 06/15/2021, PA-C Authorized by: Carroll Sage, PA-C   Consent:    Consent obtained:  Verbal   Consent given by:  Patient   Risks discussed:  Bleeding, incomplete drainage, pain, damage to other organs and infection   Alternatives discussed:  No treatment Universal protocol:    Patient identity confirmed:  Verbally with patient Location:    Type:  Abscess   Size:  2   Location:  Lower extremity   Lower extremity location:  Leg   Leg location:  R lower leg Pre-procedure details:    Skin preparation:  Povidone-iodine Sedation:    Sedation type:  None Anesthesia:    Anesthesia method:  None Procedure type:    Complexity:  Simple Procedure details:    Ultrasound guidance: no     Needle aspiration: no     Incision types:  Single straight   Incision depth:  Subcutaneous   Wound management:  Probed and deloculated   Drainage:   Purulent   Drainage amount:  Moderate   Wound treatment:  Wound left open   Packing materials:  None Post-procedure details:    Procedure completion:  Tolerated well, no immediate complications   Medications Ordered in ED Medications  lidocaine (PF) (XYLOCAINE) 1 % injection 5 mL (5 mLs Infiltration Not Given 04/14/21 1058)  ibuprofen (ADVIL) tablet 600 mg (600 mg Oral Given 04/14/21 1058)    ED Course  I have reviewed the triage vital signs and the nursing notes.  Pertinent labs & imaging results that were available during my care of the patient were reviewed by me  and considered in my medical decision making (see chart for details).    MDM Rules/Calculators/A&P                         Initial impression-patient presents with abscess on the right lower shin.  She is alert, does not appear in acute stress, vital signs show tachycardia.  Will recommend I&D for improved healing and pain relief.  Work-up-due to well-appearing patient, benign physical exam, further lab or imaging ordered at this time.  Reassessment- Patient with skin abscess located on the right lower shin, amenable to incision and drainage.  Patient tolerated procedure well.  Abscess was not large enough to warrant packing or drain.   Rule out-low suspicion for systemic infection as patient is nontoxic-appearing, vital signs reassuring.  Low suspicion for deep tissue infection as there is no fluctuance palpated after I&D was performed.  Plan-  Abscess drained-patient tolerated procedure well, she does have noted surrounding erythema, will start her on antibiotics, have her come back next 48 hours for a wound check.  will have her place warm compresses to the area, basic wound care.  Vital signs have remained stable, no indication for hospital admission.   Patient given at home care as well strict return precautions.  Patient verbalized that they understood agreed to said plan.  Final Clinical Impression(s) / ED  Diagnoses Final diagnoses:  Abscess    Rx / DC Orders ED Discharge Orders          Ordered    doxycycline (VIBRAMYCIN) 100 MG capsule  2 times daily        04/14/21 1151             Barnie Del 04/14/21 1152    Arby Barrette, MD 04/20/21 [phone]

## 2021-04-14 NOTE — Discharge Instructions (Addendum)
Abscess was drained, I have started you on antibiotics please take as prescribed.  Please apply warm compresses to the area 2-3 times daily for next 7 days, please change out the dressings after each compress.  you may take over-the-counter pain medications as needed.  Please come back to the next 48 hours for a wound check.  Come back to the emergency department if you develop chest pain, shortness of breath, severe abdominal pain, uncontrolled nausea, vomiting, diarrhea.

## 2021-04-14 NOTE — ED Triage Notes (Signed)
Pt arrives with a "boil" on right shin. States started 3 days ago. Has hx of same. States painful to walk. Has had to get them drained in past

## 2021-04-25 ENCOUNTER — Other Ambulatory Visit: Payer: Self-pay

## 2021-04-25 ENCOUNTER — Emergency Department (HOSPITAL_BASED_OUTPATIENT_CLINIC_OR_DEPARTMENT_OTHER)
Admission: EM | Admit: 2021-04-25 | Discharge: 2021-04-25 | Disposition: A | Discharge status: Home / Self Care | Payer: BC Managed Care – PPO | Attending: Emergency Medicine | Admitting: Emergency Medicine

## 2021-04-25 ENCOUNTER — Encounter (HOSPITAL_BASED_OUTPATIENT_CLINIC_OR_DEPARTMENT_OTHER): Payer: Self-pay

## 2021-04-25 ENCOUNTER — Ambulatory Visit (HOSPITAL_BASED_OUTPATIENT_CLINIC_OR_DEPARTMENT_OTHER): Admit: 2021-04-25 | Payer: BC Managed Care – PPO

## 2021-04-25 DIAGNOSIS — L03115 Cellulitis of right lower limb: Secondary | ICD-10-CM | POA: Diagnosis not present

## 2021-04-25 DIAGNOSIS — Z87891 Personal history of nicotine dependence: Secondary | ICD-10-CM | POA: Diagnosis not present

## 2021-04-25 DIAGNOSIS — Z5189 Encounter for other specified aftercare: Secondary | ICD-10-CM

## 2021-04-25 DIAGNOSIS — Z4801 Encounter for change or removal of surgical wound dressing: Secondary | ICD-10-CM | POA: Diagnosis not present

## 2021-04-25 MED ORDER — CHLORHEXIDINE GLUCONATE 2 % EX PADS: 2.0000 | MEDICATED_PAD | Freq: Every day | CUTANEOUS | 0 refills | Status: AC

## 2021-04-25 MED ORDER — OXYCODONE-ACETAMINOPHEN 5-325 MG PO TABS
2.0000 | ORAL_TABLET | Freq: Once | ORAL | Status: AC
  Administered 2021-04-25: 2 via ORAL
  Filled 2021-04-25: qty 2

## 2021-04-25 MED ORDER — SULFAMETHOXAZOLE-TRIMETHOPRIM 800-160 MG PO TABS: 1.0000 | ORAL_TABLET | Freq: Two times a day (BID) | ORAL | 0 refills | Status: AC

## 2021-04-25 MED ORDER — SULFAMETHOXAZOLE-TRIMETHOPRIM 800-160 MG PO TABS
1.0000 | ORAL_TABLET | Freq: Once | ORAL | Status: AC
  Administered 2021-04-25: 1 via ORAL
  Filled 2021-04-25: qty 1

## 2021-04-25 MED ORDER — MUPIROCIN CALCIUM 2 % NA OINT: TOPICAL_OINTMENT | NASAL | 0 refills | Status: AC

## 2021-04-25 MED ORDER — CHLORHEXIDINE GLUCONATE 0.12% ORAL RINSE (MEDLINE KIT): 15.0000 mL | Freq: Two times a day (BID) | OROMUCOSAL | 0 refills | Status: AC

## 2021-04-25 NOTE — ED Triage Notes (Signed)
Pt was had an abscess drained 11 days prior to R ankle. Pt here today for a re-check of unhealed wound.

## 2021-04-25 NOTE — Discharge Instructions (Addendum)
Abscesses and cellulitis are often caused by bacteria that live on your skin naturally all the time.  One way to get rid of this is to put a cup of bleach in a bathtub of water soaking for 10 minutes a day for a week.  If you do this, realize the bleach will stain your clothes, towels, carpets, rugs and anything else with die.  It will also make your feet slippery when used about a tub to be careful not to fall.  Another thing that works very well is chlorhexidine washes.  You can buy chlorhexidine wipes at the store and cleanse your whole body with them once a day for 7 days along with putting mupirocin ointment in your nose and finding chlorhexidine mouthwash to rinse her mouth with twice a day.   

## 2021-04-25 NOTE — ED Provider Notes (Signed)
[facility] EMERGENCY DEPARTMENT Provider Note   CSN: [id number] Arrival date & time: 04/25/21  4854     History Chief Complaint  Patient presents with   Wound Check    Krista Crosby is a 45 y.o. female.  45 year old female that presents emerged from today with a wound to her right lower leg.  Patient was seen here on the first and had I&D done of an abscess there.  Was doing better with antibiotics but then now it has become more red and painful again.  Mild edema to the right lower extremity as well.  Patient's wife helps take care of the wound however feels like it is slowly worsening.  No fever, nausea, vomiting or other associated symptoms. Had never had issues like this before but has had 3 abscesses in the last last few months.    Wound Check      Past Medical History:  Diagnosis Date   Seizures [facility])     Patient Active Problem List   Diagnosis Date Noted   Sepsis ([place]) 07/21/2020   Seizure disorder ([place]) 07/21/2020   OSA (obstructive sleep apnea) 07/21/2020   Obesity (BMI 30-39.9) 07/21/2020   Abscess of right leg 07/21/2020    Past Surgical History:  Procedure Laterality Date   FOOT SURGERY       OB History   No obstetric history on file.     No family history on file.  Social History   Tobacco Use   Smoking status: Former    Pack years: 0.00   Smokeless tobacco: Never  Substance Use Topics   Alcohol use: Yes    Comment: socially   Drug use: No    Home Medications Prior to Admission medications   Medication Sig Start Date End Date Taking? Authorizing Provider  Chlorhexidine Gluconate 2 % PADS Apply 2 each topically daily for 7 days. 04/25/21 05/02/21 Yes Glenetta Kiger, Corene Cornea, MD  chlorhexidine gluconate, MEDLINE KIT, (PERIDEX) 0.12 % solution Use as directed 15 mLs in the mouth or throat 2 (two) times daily for 7 days. 04/25/21 05/02/21 Yes Mai Longnecker, Corene Cornea, MD  mupirocin nasal ointment (BACTROBAN) 2 % Apply in each nostril daily 04/25/21  Yes  Drusilla Wampole, Corene Cornea, MD  sulfamethoxazole-trimethoprim (BACTRIM DS) 800-160 MG tablet Take 1 tablet by mouth 2 (two) times daily for 7 days. 04/25/21 05/02/21 Yes Daina Cara, Corene Cornea, MD  diclofenac Sodium (VOLTAREN) 1 % GEL Apply 4 g topically 4 (four) times daily. 03/31/21   Tedd Sias, PA  ibuprofen (MOTRIN IB) 200 MG tablet Take 1 tablet (200 mg total) by mouth every 6 (six) hours as needed for fever or moderate pain. 07/23/20   Pokhrel, Corrie Mckusick, MD  methocarbamol (ROBAXIN) 500 MG tablet Take 1 tablet (500 mg total) by mouth 2 (two) times daily. 03/31/21   Tedd Sias, PA  omeprazole (PRILOSEC OTC) 20 MG tablet Take 1 tablet (20 mg total) by mouth daily. 07/23/20   Pokhrel, Corrie Mckusick, MD    Allergies    Patient has no known allergies.  Review of Systems   Review of Systems  All other systems reviewed and are negative.  Physical Exam Updated Vital Signs BP 119/66 (BP Location: Right Arm) Comment: Simultaneous filing. User may not have seen previous data.  Pulse 70 Comment: Simultaneous filing. User may not have seen previous data.  Temp 98 F (36.7 C) (Oral)   Resp 16 Comment: Simultaneous filing. User may not have seen previous data.  Ht 5' 7"  (1.702 m)  Wt 105 kg   LMP 04/13/2021   SpO2 98% Comment: Simultaneous filing. User may not have seen previous data.  BMI 36.24 kg/m   Physical Exam Vitals and nursing note reviewed.  Constitutional:      Appearance: She is well-developed.  HENT:     Head: Normocephalic and atraumatic.     Nose: Nose normal. No congestion or rhinorrhea.     Mouth/Throat:     Mouth: Mucous membranes are moist.     Pharynx: Oropharynx is clear.  Eyes:     Pupils: Pupils are equal, round, and reactive to light.  Cardiovascular:     Rate and Rhythm: Normal rate and regular rhythm.  Pulmonary:     Effort: No respiratory distress.     Breath sounds: No stridor.  Abdominal:     General: There is no distension.  Musculoskeletal:        General: Swelling (RLE)  present.     Cervical back: Normal range of motion.  Skin:    General: Skin is warm and dry.     Comments: Half dollar sized wound to lateral distal RLE with good wound bed, granulation tissue. Some surrounding erythema and ttp.   Neurological:     General: No focal deficit present.     Mental Status: She is alert.    ED Results / Procedures / Treatments   Labs (all labs ordered are listed, but only abnormal results are displayed) Labs Reviewed - No data to display  EKG None  Radiology No results found.  Procedures Procedures   Medications Ordered in ED Medications  oxyCODONE-acetaminophen (PERCOCET/ROXICET) 5-325 MG per tablet 2 tablet (2 tablets Oral Given 04/25/21 0342)  sulfamethoxazole-trimethoprim (BACTRIM DS) 800-160 MG per tablet 1 tablet (1 tablet Oral Given 04/25/21 0342)    ED Course  I have reviewed the triage vital signs and the nursing notes.  Pertinent labs & imaging results that were available during my care of the patient were reviewed by me and considered in my medical decision making (see chart for details).    MDM Rules/Calculators/A&P                          Here with good healing wound but likely some surrounding cellulitis versus DVT.  Will start antibiotics refer to wound care center although the wound looks pretty good.  Final Clinical Impression(s) / ED Diagnoses Final diagnoses:  Visit for wound check  Cellulitis of right lower extremity    Rx / DC Orders ED Discharge Orders          Ordered    sulfamethoxazole-trimethoprim (BACTRIM DS) 800-160 MG tablet  2 times daily        04/25/21 0330    mupirocin nasal ointment (BACTROBAN) 2 %        04/25/21 0330    chlorhexidine gluconate, MEDLINE KIT, (PERIDEX) 0.12 % solution  2 times daily        04/25/21 0330    Chlorhexidine Gluconate 2 % PADS  Daily        04/25/21 0330    AMB referral to wound care center        04/25/21 0331    US Venous Img Lower Unilateral Right        04/25/21  0343             Marqual Mi, Corene Cornea, MD 04/25/21 [phone]

## 2021-04-25 NOTE — ED Notes (Signed)
Ultrasound scheduled for 14:30 PM at [facility] on 04/25/2021. Pt understands to arrive 15 minutes early.   Wound supplied sent home. Pt verbalizes understanding of how to clean and dress wound.

## 2022-02-06 ENCOUNTER — Other Ambulatory Visit: Payer: Self-pay

## 2022-02-06 ENCOUNTER — Emergency Department (HOSPITAL_BASED_OUTPATIENT_CLINIC_OR_DEPARTMENT_OTHER)
Admission: EM | Admit: 2022-02-06 | Discharge: 2022-02-06 | Disposition: A | Discharge status: Home / Self Care | Payer: BC Managed Care – PPO | Attending: Emergency Medicine | Admitting: Emergency Medicine

## 2022-02-06 ENCOUNTER — Emergency Department (HOSPITAL_BASED_OUTPATIENT_CLINIC_OR_DEPARTMENT_OTHER): Payer: BC Managed Care – PPO

## 2022-02-06 ENCOUNTER — Encounter (HOSPITAL_BASED_OUTPATIENT_CLINIC_OR_DEPARTMENT_OTHER): Payer: Self-pay

## 2022-02-06 DIAGNOSIS — M5136 Other intervertebral disc degeneration, lumbar region: Secondary | ICD-10-CM | POA: Insufficient documentation

## 2022-02-06 DIAGNOSIS — M545 Low back pain, unspecified: Secondary | ICD-10-CM

## 2022-02-06 DIAGNOSIS — G8929 Other chronic pain: Secondary | ICD-10-CM | POA: Diagnosis not present

## 2022-02-06 MED ORDER — IBUPROFEN 100 MG/5ML PO SUSP
400.0000 mg | Freq: Once | ORAL | Status: AC
  Administered 2022-02-06: 400 mg via ORAL
  Filled 2022-02-06: qty 20

## 2022-02-06 MED ORDER — ACETAMINOPHEN 160 MG/5ML PO SOLN
500.0000 mg | Freq: Once | ORAL | Status: AC
  Administered 2022-02-06: 500 mg via ORAL
  Filled 2022-02-06: qty 20.3

## 2022-02-06 NOTE — ED Notes (Signed)
ED Provider at bedside. 

## 2022-02-06 NOTE — ED Provider Notes (Signed)
?[facility] EMERGENCY DEPARTMENT ?Provider Note ? ? ?CSN: [id number] ?Arrival date & time: 02/06/22  6283 ? ?  ? ?History ? ?Chief Complaint  ?Patient presents with  ? Back Pain  ? ? ?Krista Crosby is a 46 y.o. female. ? ?Patient is a 46 year old female with a history of seizure disorder that she no longer needs medication for who is presenting today with complaints of lower back pain.  She reports that the pain has been going on for months and gradually worsening.  It is in the center of her lower back but she feels like now she even has to use her arms to lift her legs because they are uncomfortable and painful.  Patient reports that she works 12-hour days and sometimes works 6 days out of 7 and a very physical job where she is lifting, walking up and down stairs and on her feet all day long.  The pain does go down into her legs intermittently but is not constant.  She reports if she sits down on the toilet for too long her legs will go numb from the knee down but that is not a consistent feeling.  She denies any foot drop or needing to drag her leg.  1 side does not seem to be worse than the other.  She has no abdominal pain, flank pain, urinary retention, urinary or bowel incontinence.  She denies any fevers.  She does not take any medication regularly for this and denies any medications at home.  She denies any trauma. ? ?The history is provided by the patient.  ?Back Pain ?Location:  Lumbar spine ? ?  ? ?Home Medications ?Prior to Admission medications   ?Medication Sig Start Date End Date Taking? Authorizing Provider  ?diclofenac Sodium (VOLTAREN) 1 % GEL Apply 4 g topically 4 (four) times daily. 03/31/21   Gailen Shelter, PA  ?ibuprofen (MOTRIN IB) 200 MG tablet Take 1 tablet (200 mg total) by mouth every 6 (six) hours as needed for fever or moderate pain. 07/23/20   Pokhrel, Rebekah Chesterfield, MD  ?methocarbamol (ROBAXIN) 500 MG tablet Take 1 tablet (500 mg total) by mouth 2 (two) times daily. 03/31/21    Gailen Shelter, PA  ?mupirocin nasal ointment (BACTROBAN) 2 % Apply in each nostril daily 04/25/21   Mesner, Barbara Cower, MD  ?omeprazole (PRILOSEC OTC) 20 MG tablet Take 1 tablet (20 mg total) by mouth daily. 07/23/20   Pokhrel, Rebekah Chesterfield, MD  ?   ? ?Allergies    ?Patient has no known allergies.   ? ?Review of Systems   ?Review of Systems  ?Musculoskeletal:  Positive for back pain.  ? ?Physical Exam ?Updated Vital Signs ?BP (!) 147/98   Pulse 87   Temp 98 ?F (36.7 ?C) (Oral)   Resp 18   Ht 5\' 7"  (1.702 m)   Wt 108.9 kg   SpO2 99%   BMI 37.59 kg/m?  ?Physical Exam ?Vitals and nursing note reviewed.  ?Constitutional:   ?   General: She is not in acute distress. ?   Appearance: She is well-developed.  ?HENT:  ?   Head: Normocephalic and atraumatic.  ?Eyes:  ?   Pupils: Pupils are equal, round, and reactive to light.  ?Cardiovascular:  ?   Rate and Rhythm: Normal rate and regular rhythm.  ?   Pulses: Normal pulses.  ?   Heart sounds: Normal heart sounds. No murmur heard. ?  No friction rub.  ?Pulmonary:  ?   Effort: Pulmonary  effort is normal.  ?   Breath sounds: Normal breath sounds. No wheezing or rales.  ?Abdominal:  ?   General: Bowel sounds are normal. There is no distension.  ?   Palpations: Abdomen is soft.  ?   Tenderness: There is no abdominal tenderness. There is no right CVA tenderness, left CVA tenderness, guarding or rebound.  ?Musculoskeletal:     ?   General: No tenderness. Normal range of motion.  ?   Lumbar back: Bony tenderness present. Normal range of motion.  ?     Back: ? ?   Comments: No edema.  pain is worse in the lower back with flexion, no significant muscular tenderness on either side of the spine  ?Skin: ?   General: Skin is warm and dry.  ?   Findings: No rash.  ?Neurological:  ?   Mental Status: She is alert and oriented to person, place, and time.  ?   Cranial Nerves: No cranial nerve deficit.  ?   Comments: 5 out of 5 strength with hip flexion, extension bilaterally.  5 out of 5 strength  of the calf bilaterally.  Sensation intact  ?Psychiatric:     ?   Mood and Affect: Mood normal.     ?   Behavior: Behavior normal.  ? ? ?ED Results / Procedures / Treatments   ?Labs ?(all labs ordered are listed, but only abnormal results are displayed) ?Labs Reviewed - No data to display ? ?EKG ?None ? ?Radiology ?DG Lumbar Spine Complete ? ?Result Date: 02/06/2022 ?CLINICAL DATA:  Low back pain EXAM: LUMBAR SPINE - COMPLETE 4+ VIEW COMPARISON:  None. FINDINGS: No acute fracture or spondylolisthesis identified. No spondylolysis identified. Mild intervertebral disc space narrowing at L4-L5 and L5-S1. Facet arthropathy most significant in the lower lumbar spine. IMPRESSION: Degenerative changes with no acute osseous abnormality identified. Electronically Signed   By: Jannifer Hick M.D.   On: 02/06/2022 08:38   ? ?Procedures ?Procedures  ? ? ?Medications Ordered in ED ?Medications  ?ibuprofen (ADVIL) 100 MG/5ML suspension 400 mg (400 mg Oral Given 02/06/22 0833)  ?acetaminophen (TYLENOL) 160 MG/5ML solution 500 mg (500 mg Oral Given 02/06/22 0833)  ? ? ?ED Course/ Medical Decision Making/ A&P ?  ?                        ?Medical Decision Making ?Amount and/or Complexity of Data Reviewed ?External Data Reviewed: notes. ?Radiology: ordered and independent interpretation performed. Decision-making details documented in ED Course. ? ?Risk ?OTC drugs. ? ? ?Pt with gradual onset of back pain suggestive of DDD.  No neurovascular compromise and no incontinence.  Pt has no infectious sx, hx of CA  or other red flags concerning for pathologic back pain.  Pt is able to ambulate but is painful.  Normal strength and sensation on exam.  Denies trauma.  At this time no findings to suggest cauda equina.  Low suspicion for epidural abscess or hematoma. ?I independently visualized and interpreted patient's lumbar films which shows normal bony alignment and no fracture.  Radiology reports degenerative changes in the L4-5 and L5-S1 area  which would be consistent with where patient is having pain.  These findings were discussed with the patient and her questions were answered. ?Will give pt pain control and to return for developement of above sx. encouraged her to follow-up with a chiropractor and her PCP for physical therapy.  Patient will continue to take Tylenol and ibuprofen at  home. ? ? ? ? ? ? ? ? ?Final Clinical Impression(s) / ED Diagnoses ?Final diagnoses:  ?Chronic midline low back pain without sciatica  ?Degenerative disc disease, lumbar  ? ? ?Rx / DC Orders ?ED Discharge Orders   ? ? None  ? ?  ? ? ?  ?Gwyneth SproutPlunkett, Dory Verdun, MD ?02/06/22 [phone] ? ?

## 2022-02-06 NOTE — ED Notes (Signed)
Patient transported to X-ray 

## 2022-02-06 NOTE — ED Triage Notes (Signed)
Pt states low back pain for past 2 months. States using heating pad and taking aleve with little improvement.  Worse when bending down.  Not taking any aleve today ?

## 2022-02-06 NOTE — Discharge Instructions (Signed)
Continue to take the Tylenol and ibuprofen together every 6 hours as needed for pain.  Use a heating pad, hot showers and muscle rubs to also help with the pain and avoid heavy lifting. ?

## 2022-02-07 IMAGING — DX DG CHEST 1V PORT
1 series · 1 of 1 positions shown · non-contrast
Comparison: 11/11/2019.

CLINICAL DATA: Questionable sepsis.

EXAM:
PORTABLE CHEST 1 VIEW

[chest ap]
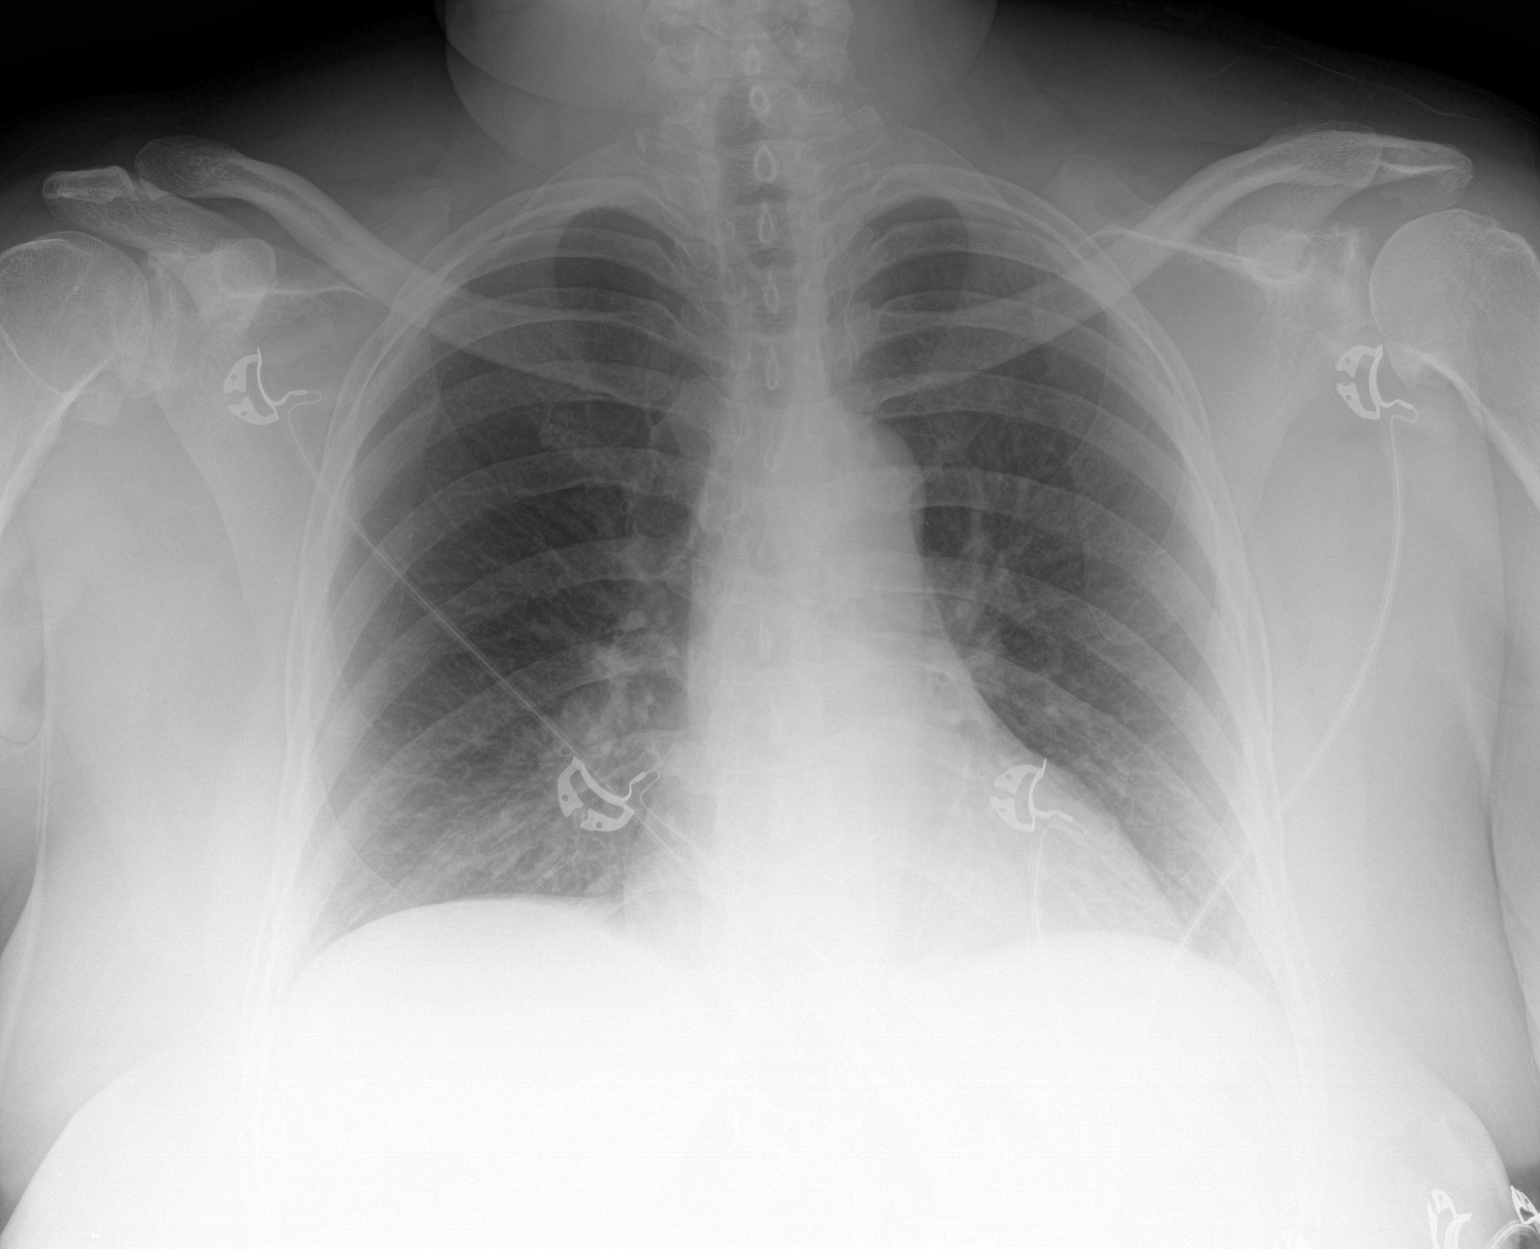

[1 of 1 positions shown; findings below may reference images not displayed]

FINDINGS: Mediastinum and hilar structures normal. Heart size normal. Mild
right base atelectasis/infiltrate. No pleural effusion or
pneumothorax.
IMPRESSION: Mild right base atelectasis/infiltrate.

## 2022-04-23 ENCOUNTER — Encounter (HOSPITAL_BASED_OUTPATIENT_CLINIC_OR_DEPARTMENT_OTHER): Payer: Self-pay | Admitting: Emergency Medicine

## 2022-04-23 ENCOUNTER — Emergency Department (HOSPITAL_BASED_OUTPATIENT_CLINIC_OR_DEPARTMENT_OTHER)
Admission: EM | Admit: 2022-04-23 | Discharge: 2022-04-24 | Disposition: A | Discharge status: Home / Self Care | Payer: BC Managed Care – PPO | Attending: Emergency Medicine | Admitting: Emergency Medicine

## 2022-04-23 ENCOUNTER — Other Ambulatory Visit: Payer: Self-pay

## 2022-04-23 ENCOUNTER — Emergency Department (HOSPITAL_BASED_OUTPATIENT_CLINIC_OR_DEPARTMENT_OTHER): Payer: BC Managed Care – PPO

## 2022-04-23 DIAGNOSIS — W182XXA Fall in (into) shower or empty bathtub, initial encounter: Secondary | ICD-10-CM | POA: Diagnosis not present

## 2022-04-23 DIAGNOSIS — M25562 Pain in left knee: Secondary | ICD-10-CM | POA: Diagnosis not present

## 2022-04-23 NOTE — ED Notes (Signed)
ED Provider at bedside. 

## 2022-04-23 NOTE — ED Triage Notes (Signed)
Pt fell out of bath tub and c/o left knee pain.

## 2022-04-24 DIAGNOSIS — M25562 Pain in left knee: Secondary | ICD-10-CM | POA: Diagnosis not present

## 2022-04-24 MED ORDER — KETOROLAC TROMETHAMINE 60 MG/2ML IM SOLN
60.0000 mg | Freq: Once | INTRAMUSCULAR | Status: AC
  Administered 2022-04-24: 60 mg via INTRAMUSCULAR
  Filled 2022-04-24: qty 2

## 2022-04-24 MED ORDER — OXYCODONE-ACETAMINOPHEN 5-325 MG PO TABS
2.0000 | ORAL_TABLET | Freq: Once | ORAL | Status: AC
  Administered 2022-04-24: 2 via ORAL
  Filled 2022-04-24: qty 2

## 2022-04-24 MED ORDER — MELOXICAM 15 MG PO TABS: 15.0000 mg | ORAL_TABLET | Freq: Every day | ORAL | 0 refills | Status: AC

## 2022-04-24 NOTE — ED Provider Notes (Signed)
[facility] EMERGENCY DEPARTMENT Provider Note   CSN: [id number] Arrival date & time: 04/23/22  1928     History  Chief Complaint  Patient presents with   Leg Injury    CHESSICA AUDIA is a 46 y.o. female.  46 yo F here with left knee pain after a fall. Patient states she slipped on some water and hr left leg went back 'like a chicken wing'. Has pain in knee since then with difficulty walking. Able to bear weight without significant pain but when bending it or first stepping with a bent knee it hurts. No other injuries.         Home Medications Prior to Admission medications   Medication Sig Start Date End Date Taking? Authorizing Provider  meloxicam (MOBIC) 15 MG tablet Take 1 tablet (15 mg total) by mouth daily. 04/24/22  Yes Willene Holian, Barbara Cower, MD  diclofenac Sodium (VOLTAREN) 1 % GEL Apply 4 g topically 4 (four) times daily. 03/31/21   Gailen Shelter, PA  ibuprofen (MOTRIN IB) 200 MG tablet Take 1 tablet (200 mg total) by mouth every 6 (six) hours as needed for fever or moderate pain. 07/23/20   Pokhrel, Rebekah Chesterfield, MD  methocarbamol (ROBAXIN) 500 MG tablet Take 1 tablet (500 mg total) by mouth 2 (two) times daily. 03/31/21   Gailen Shelter, PA  mupirocin nasal ointment (BACTROBAN) 2 % Apply in each nostril daily 04/25/21   Noretta Frier, Barbara Cower, MD  omeprazole (PRILOSEC OTC) 20 MG tablet Take 1 tablet (20 mg total) by mouth daily. 07/23/20   Pokhrel, Rebekah Chesterfield, MD      Allergies    Patient has no known allergies.    Review of Systems   Review of Systems  Physical Exam Updated Vital Signs BP 138/87   Pulse (!) 104   Temp 98.1 F (36.7 C) (Oral)   Resp 18   Ht 5\' 7"  (1.702 m)   Wt 117.9 kg   SpO2 95%   BMI 40.72 kg/m  Physical Exam Vitals and nursing note reviewed.  Constitutional:      Appearance: She is well-developed.  HENT:     Head: Normocephalic and atraumatic.     Mouth/Throat:     Mouth: Mucous membranes are moist.     Pharynx: Oropharynx is clear.  Eyes:      Pupils: Pupils are equal, round, and reactive to light.  Cardiovascular:     Rate and Rhythm: Normal rate and regular rhythm.  Pulmonary:     Effort: No respiratory distress.     Breath sounds: No stridor.  Abdominal:     General: Abdomen is flat. There is no distension.  Musculoskeletal:        General: Swelling (small left knee effusion) and tenderness (mild diffuse left knee) present.     Cervical back: Normal range of motion.  Skin:    General: Skin is warm and dry.  Neurological:     General: No focal deficit present.     Mental Status: She is alert.     ED Results / Procedures / Treatments   Labs (all labs ordered are listed, but only abnormal results are displayed) Labs Reviewed - No data to display  EKG None  Radiology DG Knee Complete 4 Views Left  Result Date: 04/23/2022 CLINICAL DATA:  Fall with injury EXAM: LEFT KNEE - COMPLETE 4+ VIEW COMPARISON:  None Available. FINDINGS: No fracture or malalignment. Minimal patellofemoral degenerative change. Trace knee effusion IMPRESSION: No acute osseous abnormality. Minimal degenerative  change with small effusion Electronically Signed   By: Jasmine Pang M.D.   On: 04/23/2022 20:02    Procedures Procedures    Medications Ordered in ED Medications  ketorolac (TORADOL) injection 60 mg (60 mg Intramuscular Given 04/24/22 0022)  oxyCODONE-acetaminophen (PERCOCET/ROXICET) 5-325 MG per tablet 2 tablet (2 tablets Oral Given 04/24/22 0022)    ED Course/ Medical Decision Making/ A&P                           Medical Decision Making Amount and/or Complexity of Data Reviewed Radiology: ordered.  Risk Prescription drug management.   Likely ligamentous strain. No obvious fracture, able to bear weight so no need for CT. May need MRI if not improving in expected timeframe, otherwise supportive care for the next few days and pcp/ortho follow up as needed (referral provided).    Final Clinical Impression(s) / ED  Diagnoses Final diagnoses:  Acute pain of left knee    Rx / DC Orders ED Discharge Orders          Ordered    meloxicam (MOBIC) 15 MG tablet  Daily        04/24/22 0042    AMB referral to orthopedics        04/24/22 0043              Claudeen Leason, Barbara Cower, MD 04/24/22 [phone]

## 2022-04-24 NOTE — ED Notes (Signed)
Pt discharged to home. Discharge instructions have been discussed with patient and/or family members. Pt verbally acknowledges understanding d/c instructions, and endorses comprehension to checkout at registration before leaving.  °

## 2022-09-28 ENCOUNTER — Emergency Department (HOSPITAL_BASED_OUTPATIENT_CLINIC_OR_DEPARTMENT_OTHER): Payer: BC Managed Care – PPO

## 2022-09-28 ENCOUNTER — Other Ambulatory Visit: Payer: Self-pay

## 2022-09-28 ENCOUNTER — Emergency Department (HOSPITAL_BASED_OUTPATIENT_CLINIC_OR_DEPARTMENT_OTHER)
Admission: EM | Admit: 2022-09-28 | Discharge: 2022-09-29 | Disposition: A | Discharge status: Home / Self Care | Payer: BC Managed Care – PPO | Attending: Emergency Medicine | Admitting: Emergency Medicine

## 2022-09-28 ENCOUNTER — Encounter (HOSPITAL_BASED_OUTPATIENT_CLINIC_OR_DEPARTMENT_OTHER): Payer: Self-pay | Admitting: Emergency Medicine

## 2022-09-28 DIAGNOSIS — R519 Headache, unspecified: Secondary | ICD-10-CM | POA: Diagnosis present

## 2022-09-28 DIAGNOSIS — G8929 Other chronic pain: Secondary | ICD-10-CM | POA: Diagnosis not present

## 2022-09-28 DIAGNOSIS — Z87891 Personal history of nicotine dependence: Secondary | ICD-10-CM | POA: Diagnosis not present

## 2022-09-28 MED ORDER — KETOROLAC TROMETHAMINE 15 MG/ML IJ SOLN
15.0000 mg | Freq: Once | INTRAMUSCULAR | Status: AC
Start: 2022-09-28 — End: 2022-09-28
  Administered 2022-09-28: 15 mg via INTRAVENOUS
  Filled 2022-09-28: qty 1

## 2022-09-28 MED ORDER — DIPHENHYDRAMINE HCL 50 MG/ML IJ SOLN
25.0000 mg | Freq: Once | INTRAMUSCULAR | Status: AC
  Administered 2022-09-28: 25 mg via INTRAVENOUS
  Filled 2022-09-28: qty 1

## 2022-09-28 MED ORDER — PROCHLORPERAZINE EDISYLATE 10 MG/2ML IJ SOLN
10.0000 mg | Freq: Once | INTRAMUSCULAR | Status: AC
  Administered 2022-09-28: 10 mg via INTRAVENOUS
  Filled 2022-09-28: qty 2

## 2022-09-28 MED ORDER — SODIUM CHLORIDE 0.9 % IV BOLUS
1000.0000 mL | Freq: Once | INTRAVENOUS | Status: AC
  Administered 2022-09-28: 1000 mL via INTRAVENOUS

## 2022-09-28 NOTE — ED Provider Notes (Signed)
[facility]EMERGENCY DEPT [facility] Emergency Department Provider Note MRN:  [id number]  Arrival date & time: 09/29/22     Chief Complaint   Headache   History of Present Illness   Krista Crosby is a 46 y.o. year-old female with a history of seizure disorder presenting to the ED with chief complaint of headache.  Persistent frequent headaches for the past few months.  Has been taking BC powder every day to try to help with headaches, sometimes multiple times a day.  No fever, no numbness or weakness to the arms or legs.  Worse with bright lights and loud noises.  Review of Systems  A thorough review of systems was obtained and all systems are negative except as noted in the HPI and PMH.   Patient's Health History    Past Medical History:  Diagnosis Date   Seizures [facility])     Past Surgical History:  Procedure Laterality Date   FOOT SURGERY      History reviewed. No pertinent family history.  Social History   Socioeconomic History   Marital status: Single    Spouse name: Not on file   Number of children: Not on file   Years of education: Not on file   Highest education level: Not on file  Occupational History   Not on file  Tobacco Use   Smoking status: Former   Smokeless tobacco: Never  Substance and Sexual Activity   Alcohol use: Yes    Comment: socially   Drug use: No   Sexual activity: Not on file  Other Topics Concern   Not on file  Social History Narrative   Not on file   Social Determinants of Health   Financial Resource Strain: Not on file  Food Insecurity: Not on file  Transportation Needs: Not on file  Physical Activity: Not on file  Stress: Not on file  Social Connections: Not on file  Intimate Partner Violence: Not on file     Physical Exam   Vitals:   09/28/22 2300 09/28/22 2315  BP: 125/68 126/78  Pulse: 63 74  Resp:  15  Temp:    SpO2: 100% 100%    CONSTITUTIONAL: Well-appearing, NAD NEURO/PSYCH:  Alert and oriented x 3,  no focal deficits, no meningismus EYES:  eyes equal and reactive ENT/NECK:  no LAD, no JVD CARDIO: Regular rate, well-perfused, normal S1 and S2 PULM:  CTAB no wheezing or rhonchi GI/GU:  non-distended, non-tender MSK/SPINE:  No gross deformities, no edema SKIN:  no rash, atraumatic   *Additional and/or pertinent findings included in MDM below  Diagnostic and Interventional Summary    EKG Interpretation  Date/Time:    Ventricular Rate:    PR Interval:    QRS Duration:   QT Interval:    QTC Calculation:   R Axis:     Text Interpretation:         Labs Reviewed - No data to display  CT HEAD WO CONTRAST ( )  Final Result      Medications  diphenhydrAMINE (BENADRYL) injection 25 mg (25 mg Intravenous Given 09/28/22 2338)  prochlorperazine (COMPAZINE) injection 10 mg (10 mg Intravenous Given 09/28/22 2337)  ketorolac (TORADOL) 15 MG/ML injection 15 mg (15 mg Intravenous Given 09/28/22 2338)  sodium chloride 0.9 % bolus 1,000 mL (1,000 mLs Intravenous New Bag/Given 09/28/22 2341)     Procedures  /  Critical Care Procedures  ED Course and Medical Decision Making  Initial Impression and Ddx Suspect primary headache disorder versus  rebound headaches, given the significant change/new headaches starting few months ago will obtain CT head to exclude obvious structural abnormality or mass.  Providing migraine cocktail and will reassess.  Past medical/surgical history that increases complexity of ED encounter: None  Interpretation of Diagnostics CT head is without acute process.  Patient Reassessment and Ultimate Disposition/Management     Patient feeling much better after migraine cocktail, appropriate for discharge.  Patient management required discussion with the following services or consulting groups:  None  Complexity of Problems Addressed Acute illness or injury that poses threat of life of bodily function  Additional Data Reviewed and Analyzed Further history  obtained from: Further history from spouse/family member  Additional Factors Impacting ED Encounter Risk Prescriptions  Elmer Sow. Pilar Plate, MD [facility] [email or web address]  Final Clinical Impressions(s) / ED Diagnoses     ICD-10-CM   1. Chronic nonintractable headache, unspecified headache type  R51.9    G89.29       ED Discharge Orders          Ordered    prochlorperazine (COMPAZINE) 10 MG tablet  2 times daily PRN        09/29/22 0035             Discharge Instructions Discussed with and Provided to Patient:     Discharge Instructions      You were evaluated in the Emergency Department and after careful evaluation, we did not find any emergent condition requiring admission or further testing in the hospital.  Your exam/testing today is overall reassuring.  Recommend follow-up with your primary care doctor to discuss your symptoms, especially if your headaches continue.  Stop taking the over-the-counter headache medicines.  Use the Compazine as needed.  Please return to the Emergency Department if you experience any worsening of your condition.   Thank you for allowing [place] to be a part of your care.       Sabas Sous, MD 09/29/22 [phone]

## 2022-09-28 NOTE — ED Triage Notes (Signed)
Patient c/o intermittent headache for months, states her family made her come be checked out tonight due to the continuous headaches. Patient has been taking tylenol with relief, but the headache returns.

## 2022-09-29 MED ORDER — PROCHLORPERAZINE MALEATE 10 MG PO TABS: 10.0000 mg | ORAL_TABLET | Freq: Two times a day (BID) | ORAL | 0 refills | Status: DC | PRN

## 2022-09-29 NOTE — Discharge Instructions (Signed)
You were evaluated in the Emergency Department and after careful evaluation, we did not find any emergent condition requiring admission or further testing in the hospital.  Your exam/testing today is overall reassuring.  Recommend follow-up with your primary care doctor to discuss your symptoms, especially if your headaches continue.  Stop taking the over-the-counter headache medicines.  Use the Compazine as needed.  Please return to the Emergency Department if you experience any worsening of your condition.   Thank you for allowing [place] to be a part of your care.

## 2022-11-28 ENCOUNTER — Emergency Department (HOSPITAL_BASED_OUTPATIENT_CLINIC_OR_DEPARTMENT_OTHER)
Admission: EM | Admit: 2022-11-28 | Discharge: 2022-11-28 | Disposition: A | Discharge status: Home / Self Care | Payer: BC Managed Care – PPO | Attending: Emergency Medicine | Admitting: Emergency Medicine

## 2022-11-28 ENCOUNTER — Emergency Department (HOSPITAL_BASED_OUTPATIENT_CLINIC_OR_DEPARTMENT_OTHER): Payer: BC Managed Care – PPO

## 2022-11-28 ENCOUNTER — Other Ambulatory Visit: Payer: Self-pay

## 2022-11-28 ENCOUNTER — Encounter (HOSPITAL_BASED_OUTPATIENT_CLINIC_OR_DEPARTMENT_OTHER): Payer: Self-pay

## 2022-11-28 DIAGNOSIS — R519 Headache, unspecified: Secondary | ICD-10-CM | POA: Insufficient documentation

## 2022-11-28 DIAGNOSIS — M25551 Pain in right hip: Secondary | ICD-10-CM | POA: Diagnosis not present

## 2022-11-28 DIAGNOSIS — I10 Essential (primary) hypertension: Secondary | ICD-10-CM | POA: Insufficient documentation

## 2022-11-28 LAB — CBC
HCT: 40.2 % (ref 36.0–46.0)
Hemoglobin: 12.6 g/dL (ref 12.0–15.0)
MCH: 26.1 pg (ref 26.0–34.0)
MCHC: 31.3 g/dL (ref 30.0–36.0)
MCV: 83.2 fL (ref 80.0–100.0)
Platelets: 321 10*3/uL (ref 150–400)
RBC: 4.83 MIL/uL (ref 3.87–5.11)
RDW: 14.9 % (ref 11.5–15.5)
WBC: 8.2 10*3/uL (ref 4.0–10.5)
nRBC: 0 % (ref 0.0–0.2)

## 2022-11-28 LAB — BASIC METABOLIC PANEL
Anion gap: 6 (ref 5–15)
BUN: 16 mg/dL (ref 6–20)
CO2: 22 mmol/L (ref 22–32)
Calcium: 8.4 mg/dL — ABNORMAL LOW (ref 8.9–10.3)
Chloride: 106 mmol/L (ref 98–111)
Creatinine, Ser: 0.83 mg/dL (ref 0.44–1.00)
GFR, Estimated: >60 mL/min (ref >60)
Glucose, Bld: 85 mg/dL (ref 70–99)
Potassium: 3.8 mmol/L (ref 3.5–5.1)
Sodium: 134 mmol/L — ABNORMAL LOW (ref 135–145)

## 2022-11-28 MED ORDER — LIDOCAINE HCL (PF) 1 % IJ SOLN
INTRAMUSCULAR | Status: AC
  Filled 2022-11-28: qty 5

## 2022-11-28 MED ORDER — ACETAZOLAMIDE 125 MG PO TABS: 250.0000 mg | ORAL_TABLET | Freq: Two times a day (BID) | ORAL | 1 refills | Status: DC

## 2022-11-28 MED ORDER — DEXAMETHASONE SODIUM PHOSPHATE 10 MG/ML IJ SOLN
10.0000 mg | Freq: Once | INTRAMUSCULAR | Status: AC
  Administered 2022-11-28: 10 mg via INTRAVENOUS
  Filled 2022-11-28: qty 1

## 2022-11-28 MED ORDER — METOCLOPRAMIDE HCL 5 MG/ML IJ SOLN
5.0000 mg | Freq: Once | INTRAMUSCULAR | Status: AC
  Administered 2022-11-28: 5 mg via INTRAVENOUS
  Filled 2022-11-28: qty 2

## 2022-11-28 MED ORDER — KETOROLAC TROMETHAMINE 30 MG/ML IJ SOLN
30.0000 mg | Freq: Once | INTRAMUSCULAR | Status: AC
  Administered 2022-11-28: 30 mg via INTRAVENOUS
  Filled 2022-11-28: qty 1

## 2022-11-28 MED ORDER — IOHEXOL 350 MG/ML SOLN
100.0000 mL | Freq: Once | INTRAVENOUS | Status: AC | PRN
  Administered 2022-11-28: 75 mL via INTRAVENOUS

## 2022-11-28 MED ORDER — ACETAZOLAMIDE 250 MG PO TABS: 250.0000 mg | ORAL_TABLET | Freq: Two times a day (BID) | ORAL | 1 refills | Status: DC

## 2022-11-28 MED ORDER — SODIUM CHLORIDE 0.9 % IV BOLUS
500.0000 mL | Freq: Once | INTRAVENOUS | Status: AC
  Administered 2022-11-28: 500 mL via INTRAVENOUS

## 2022-11-28 NOTE — ED Notes (Signed)
Tried to access IV x 2 attempts. Unable to obtain site. Will have another staff member try [place] IV

## 2022-11-28 NOTE — ED Notes (Signed)
Patient transported to CT 

## 2022-11-28 NOTE — Discharge Instructions (Addendum)
Contact a health care provider if: You have changes in your vision, such as: Double vision. Blurred vision. Poor peripheral vision. Get help right away if: You have any of the following symptoms and they get worse or do not get better: Headaches. Nausea. Vomiting. Sudden trouble seeing.

## 2022-11-28 NOTE — ED Triage Notes (Signed)
Pt reports to ER this am with complaints of right hip pain and a headache. States that the headache has been coming and going for a few weeks. States the hip pain started 1 week ago and has been getting worse.

## 2022-11-28 NOTE — ED Notes (Signed)
Assisted PA with

## 2022-11-28 NOTE — ED Provider Notes (Signed)
[facility] Provider Note   CSN: [id number] Arrival date & time: 11/28/22  [id number]     History  Chief Complaint  Patient presents with   Hip Pain    Krista Crosby is a 47 y.o. female who presents emergency department with a chief complaint of headache.  She has a past medical history of obesity seizures as a child which she grew out of.  Patient reports daily headaches for the past 3 months.  It is relieved somewhat by over-the-counter anti-inflammatory medications.  She reports having blurry vision, global severe headaches, light sensitivity.  She has never had migraine headaches prior to the past 3 months.  She denies nausea, vomiting, focal neurologic deficit, field changes.  Her wife is at bedside and states that they have talked to her primary care doctor however have not been able to follow-up or get any real results yet.  She states she has been seen in the ER but not has not had any imaging.   Hip Pain       Home Medications Prior to Admission medications   Medication Sig Start Date End Date Taking? Authorizing Provider  diclofenac Sodium (VOLTAREN) 1 % GEL Apply 4 g topically 4 (four) times daily. 03/31/21   Tedd Sias, PA  ibuprofen (MOTRIN IB) 200 MG tablet Take 1 tablet (200 mg total) by mouth every 6 (six) hours as needed for fever or moderate pain. 07/23/20   Pokhrel, Corrie Mckusick, MD  meloxicam (MOBIC) 15 MG tablet Take 1 tablet (15 mg total) by mouth daily. 04/24/22   Mesner, Corene Cornea, MD  methocarbamol (ROBAXIN) 500 MG tablet Take 1 tablet (500 mg total) by mouth 2 (two) times daily. 03/31/21   Tedd Sias, PA  mupirocin nasal ointment (BACTROBAN) 2 % Apply in each nostril daily 04/25/21   Mesner, Corene Cornea, MD  omeprazole (PRILOSEC OTC) 20 MG tablet Take 1 tablet (20 mg total) by mouth daily. 07/23/20   Pokhrel, Corrie Mckusick, MD  prochlorperazine (COMPAZINE) 10 MG tablet Take 1 tablet (10 mg total) by mouth 2 (two) times daily as  needed (Headache). 09/29/22   Maudie Flakes, MD      Allergies    Patient has no known allergies.    Review of Systems   Review of Systems  Physical Exam Updated Vital Signs BP (!) 126/95 (BP Location: Right Arm)   Pulse 86   Temp 98.1 F (36.7 C) (Oral)   Resp 18   Ht 5' 7"$  (1.702 m)   Wt 113.4 kg   SpO2 100%   BMI 39.16 kg/m  Physical Exam Vitals and nursing note reviewed.  Constitutional:      General: She is not in acute distress.    Appearance: She is well-developed. She is not diaphoretic.  HENT:     Head: Normocephalic and atraumatic.     Right Ear: External ear normal.     Left Ear: External ear normal.     Nose: Nose normal.     Mouth/Throat:     Mouth: Mucous membranes are moist.  Eyes:     General: No scleral icterus.    Extraocular Movements: Extraocular movements intact.     Conjunctiva/sclera: Conjunctivae normal.     Pupils: Pupils are equal, round, and reactive to light.     Comments: No horizontal, vertical or rotational nystagmus  Neck:     Comments: Full active and passive ROM without pain No midline or paraspinal tenderness No nuchal rigidity  or meningeal signs Cardiovascular:     Rate and Rhythm: Normal rate and regular rhythm.     Heart sounds: Normal heart sounds. No murmur heard.    No friction rub. No gallop.  Pulmonary:     Effort: Pulmonary effort is normal. No respiratory distress.     Breath sounds: Normal breath sounds. No wheezing or rales.  Abdominal:     General: Bowel sounds are normal. There is no distension.     Palpations: Abdomen is soft. There is no mass.     Tenderness: There is no abdominal tenderness. There is no guarding or rebound.  Musculoskeletal:        General: Normal range of motion.     Cervical back: Normal range of motion and neck supple.  Lymphadenopathy:     Cervical: No cervical adenopathy.  Skin:    General: Skin is warm and dry.     Findings: No rash.  Neurological:     Mental Status: She is  alert and oriented to person, place, and time.     Cranial Nerves: No cranial nerve deficit.     Motor: No abnormal muscle tone.     Coordination: Coordination normal.     Deep Tendon Reflexes: Reflexes are normal and symmetric.     Comments: Mental Status:  Alert, oriented, thought content appropriate. Speech fluent without evidence of aphasia. Able to follow 2 step commands without difficulty.  Cranial Nerves:  II:  Peripheral visual fields grossly normal, pupils equal, round, reactive to light III,IV, VI: ptosis not present, extra-ocular motions intact bilaterally  V,VII: smile symmetric, facial light touch sensation equal VIII: hearing grossly normal bilaterally  IX,X: midline uvula rise  XI: bilateral shoulder shrug equal and strong XII: midline tongue extension  Motor:  5/5 in upper and lower extremities bilaterally including strong and equal grip strength and dorsiflexion/plantar flexion Sensory: Pinprick and light touch normal in all extremities.  Deep Tendon Reflexes: 2+ and symmetric  Cerebellar: normal finger-to-nose with bilateral upper extremities Gait: normal gait and balance CV: distal pulses palpable throughout   Psychiatric:        Behavior: Behavior normal.        Thought Content: Thought content normal.        Judgment: Judgment normal.     ED Results / Procedures / Treatments   Labs (all labs ordered are listed, but only abnormal results are displayed) Labs Reviewed - No data to display  EKG None  Radiology No results found.  Procedures .Lumbar Puncture  Date/Time: 11/28/2022 6:45 PM  Performed by: Margarita Mail, PA-C Authorized by: Margarita Mail, PA-C   Consent:    Consent obtained:  Verbal   Consent given by:  Patient   Risks, benefits, and alternatives were discussed: yes     Risks discussed:  Bleeding, infection, pain, headache, nerve damage and repeat procedure Universal protocol:    Patient identity confirmed:  Verbally with  patient Pre-procedure details:    Procedure purpose:  Diagnostic   Preparation: Patient was prepped and draped in usual sterile fashion   Anesthesia:    Anesthesia method:  Local infiltration   Local anesthetic:  Lidocaine 1% w/o epi Procedure details:    Lumbar space: attempts at L3/4, L4/5.   Needle type:  Spinal needle - Quincke tip   Needle length (in):  5.0 (5.0  and 7.0)   Number of attempts:  2 Post-procedure details:    Puncture site:  Adhesive bandage applied   Procedure completion:  Tolerated Comments:     Unable to access csf with longest available spinal needle hubbed into patient's back Ultrasound ED Ocular  Date/Time: 11/28/2022 6:50 PM  Performed by: Margarita Mail, PA-C Authorized by: Margarita Mail, PA-C   PROCEDURE DETAILS:    Indications: evaluation for increased intracranial pressure     Assessed:  Left eye and right eye   Left eye axial view: obtained     Left eye saggital view: obtained     Right eye axial view: obtained     Right eye sagittal view: obtained     Images: archived   RIGHT EYE FINDINGS:     right eye increased optic nerve sheath diameter   Optic nerve sheath diameter (mm):  6 LEFT EYE FINDINGS:     left eye increased optic nerve sheath diameter   Optic nerve sheath diameter (mm):  7     Medications Ordered in ED Medications - No data to display  ED Course/ Medical Decision Making/ A&P Clinical Course as of 11/28/22 1844  Wed Nov 28, 2022  1026 Patient is a 47 year old female with daily headaches for the past 3 months that are fairly debilitating.  She does have the correct body habitus for IIH.. [AH]  1026 DG Hip Infant Unilat W or Wo Pelvis 2-3 Views Right [AH]  [id number] Patient's ocular ultrasound shows bilateral ocular sheath widening at 6.3 on the right and 6.7 on the left.  With Dr. Su Monks who states that this is concerning for intracranial hypertension.  She recommends getting a CT venogram and a lumbar puncture. [AH]     Clinical Course User Index [AH] Margarita Mail, PA-C                             Medical Decision Making Patient here with persistent, worsening headache. Emergent considerations for headache include subarachnoid hemorrhage, meningitis, temporal arteritis, glaucoma, cerebral ischemia, carotid/vertebral dissection, intracranial tumor, Venous sinus thrombosis, carbon monoxide poisoning, acute or chronic subdural hemorrhage.  Other considerations include: Migraine, Cluster headache, Hypertension, Caffeine, alcohol, or drug withdrawal, Pseudotumor cerebri, Arteriovenous malformation, Head injury, Neurocysticercosis, Post-lumbar puncture, Preeclampsia, Tension headache, Sinusitis, Cervical arthritis, Refractive error causing strain, Dental abscess, Otitis media, Temporomandibular joint syndrome, Depression, Somatoform disorder (eg, somatization) Trigeminal neuralgia, Glossopharyngeal neuralgia.  Lab , no significant findings.  I ordered, visualized and interpreted CT head, CT venogram, right hip and lumbar film.  There are no significant findings on all the images.   Patient's ocular ultrasound concerning for enlarged sheath suggestive of papilledema and therefore extremely concerning for idiopathic intracranial hypertension.  Unable to perform a successful lumbar puncture.  Patient given migraine cocktail with significant improvement in her headache today.  After review of all data points and discussion with Dr. Quinn Axe x 2 will discharge the patient with very close outpatient neuro follow-up and begin her on acetazolamide 250 mg twice daily.  Discussed return precautions and outpatient follow-up with the patient and her wife at bedside.  She appears otherwise appropriate for discharge   Amount and/or Complexity of Data Reviewed Labs: ordered. Radiology: ordered. Decision-making details documented in ED Course.  Risk Prescription drug management.           Final Clinical Impression(s) /  ED Diagnoses Final diagnoses:  Bad headache    Rx / DC Orders ED Discharge Orders     None         Margarita Mail, PA-C 11/28/22 1854    Lawsing,  Jeneen Rinks, MD 11/28/22 2015

## 2022-12-03 ENCOUNTER — Ambulatory Visit (INDEPENDENT_AMBULATORY_CARE_PROVIDER_SITE_OTHER): Payer: Self-pay | Admitting: Neurology

## 2022-12-03 ENCOUNTER — Other Ambulatory Visit: Payer: Self-pay | Admitting: *Deleted

## 2022-12-03 ENCOUNTER — Ambulatory Visit
Admission: RE | Admit: 2022-12-03 | Discharge: 2022-12-03 | Disposition: A | Discharge status: Home / Self Care | Payer: BC Managed Care – PPO | Source: Ambulatory Visit | Attending: Neurology | Admitting: Neurology

## 2022-12-03 ENCOUNTER — Telehealth: Payer: Self-pay | Admitting: *Deleted

## 2022-12-03 ENCOUNTER — Encounter: Payer: Self-pay | Admitting: Neurology

## 2022-12-03 VITALS — BP 130/80 | HR 101 | Ht 67.0 in | Wt 237.0 lb

## 2022-12-03 VITALS — BP 131/75 | HR 89

## 2022-12-03 DIAGNOSIS — E669 Obesity, unspecified: Secondary | ICD-10-CM

## 2022-12-03 DIAGNOSIS — R519 Headache, unspecified: Secondary | ICD-10-CM

## 2022-12-03 DIAGNOSIS — G8929 Other chronic pain: Secondary | ICD-10-CM

## 2022-12-03 HISTORY — PX: LUMBAR PUNCTURE: SHX1985

## 2022-12-03 LAB — GLUCOSE, CSF: Glucose, CSF: 75 mg/dL (ref 40–80)

## 2022-12-03 LAB — CSF CELL COUNT WITH DIFFERENTIAL
RBC Count, CSF: 1 cells/uL — ABNORMAL HIGH
TOTAL NUCLEATED CELL: 1 cells/uL (ref 0–5)

## 2022-12-03 LAB — PROTEIN, CSF: Total Protein, CSF: 33 mg/dL (ref 15–45)

## 2022-12-03 NOTE — Discharge Instructions (Signed)

## 2022-12-03 NOTE — Telephone Encounter (Signed)
Called pt and LVM asking for call back to go over results of test and also offered her appt with Dr Jaynee Eagles tomorrow 2/20 at 3 pm arrival 15-20 minutes ahead of time. Left office number for call back. We can try to reach her again tomorrow morning. Referrals had LVM for patient earlier about the appt tomorrow with Dr Jaynee Eagles.

## 2022-12-03 NOTE — Patient Instructions (Signed)
  Idiopathic Intracranial Hypertension  Idiopathic intracranial hypertension (IIH) is a condition that increases pressure around the brain. The fluid that surrounds the brain and spinal cord (cerebrospinal fluid, or CSF) increases and causes the pressure. Idiopathic means that the cause of this condition is not known. IIH affects the brain and spinal cord. If this condition is not treated, it can cause vision loss or blindness. What are the causes? The cause of this condition is not known. What increases the risk? The following factors may make you more likely to develop this condition: Being obese. Being a person who is female, between the ages of 72 and 71 years old, and who has not gone through menopause. Taking certain medicines, such as birth control, acne medicines, or steroids. What are the signs or symptoms? Symptoms of this condition include: Headaches. This is the most common symptom. Brief periods of total blindness. Double vision, blurred vision, or poor side (peripheral) vision. Pain in the shoulders or neck. Nausea and vomiting. A sound like rushing water or a pulsing sound within the ears (pulsatile tinnitus), or ringing in the ears. How is this diagnosed? This condition may be diagnosed based on: Your symptoms and medical history. Imaging tests of the brain, such as: CT scan. MRI. Magnetic resonance venogram (MRV) to check the veins. Diagnostic lumbar puncture. This is a procedure to remove and examine a sample of CSF. This procedure can determine whether your fluid pressure is too high. An eye exam to check for swelling or nerve damage in the eyes. How is this treated? Treatment for this condition depends on the symptoms. The goal of treatment is to decrease the pressure around your brain. Common treatments include: Weight loss through healthy eating, salt restriction, and exercise, if you are overweight. Medicines to decrease the production of CSF and lower the  pressure within your skull. Medicines to prevent or treat headaches. Other treatments may include: Surgery to place drains (shunts) in your brain to remove extra fluid. Lumbar puncture to remove extra CSF. Follow these instructions at home: If you are overweight or obese, work with your health care provider to lose weight. Take over-the-counter and prescription medicines only as told by your health care provider. Ask your health care provider if the medicine prescribed to you requires you to avoid driving or using machinery. Do not use any products that contain nicotine or tobacco. These products include cigarettes, chewing tobacco, and vaping devices, such as e-cigarettes. If you need help quitting, ask your health care provider. Keep all follow-up visits. Your health care provider will need to monitor you regularly. Contact a health care provider if: You have changes in your vision, such as: Double vision. Blurred vision. Poor peripheral vision. Get help right away if: You have any of the following symptoms and they get worse or do not get better: Headaches. Nausea. Vomiting. Sudden trouble seeing. This information is not intended to replace advice given to you by your health care provider. Make sure you discuss any questions you have with your health care provider. Document Revised: 02/27/2022 Document Reviewed: 02/06/2022 Elsevier Patient Education  [place].

## 2022-12-03 NOTE — Progress Notes (Signed)
Patient sitting in complete dark today in the office, send from ED for possible IDIOPATHIC INTRACRANIAL HYPERTENSION, were not able to get LP will send her directly to [facility] imaging for LP right now and [place] visit and reschedule we need an opening pressure   See below for precharting  Provider:  Dr Jaynee Eagles Requesting Provider: Margarita Mail, PA-C Primary Care Provider:  Lorenda Hatchet, FNP  CC:  headache  HPI:  Krista Crosby is a 47 y.o. female here as requested by Margarita Mail, PA-C for hospital follow up for headache and questioned IDIOPATHIC INTRACRANIAL HYPERTENSION.  She has a past medical history of obstructive sleep apnea, seizure disorder, obesity, abscess of right leg, sepsis.  I reviewed emergency room notes from 09/30/2023 where she presented with a severe headache.  She complained of persistent frequent headaches for the past few months, taking BC powder every day to try to help with the headaches, sometimes multiple times a day, worse with bright lights and loud noises, no fever numbness or weakness to the arms or legs.  A migraine cocktail helped.  We also considered rebound headaches and talk to her about it.  CT head was negative for acute process.  She was seen back in the emergency room November 28, 2022 with repeat CT head and CTV which were again negative for anything acute.  Patient reported headaches again, blurry vision, global severe headaches, light sensitivity, ongoing 3 months daily, no other focal neurologic deficits or vision changes.  Physical and neurologic exam are normal.  LP was attempted but unsuccessful.  Ultrasound in ED ocular showed that the right eye showed increased optic nerve sheath diameter 6 mm as did the left at 7 mm.  Given her body habitus and age IIH was a possibility.  Again migraine cocktail with significant improvement.  They discharged her on acetazolamide 250 mg twice daily.  Migraine cocktails help. She has had headaches 4 months,  worsening, band around her head,   Reviewed notes, labs and imaging from outside physicians, which showed:  From a thorough review of records, medications tried include meloxicam, methocarbamol, Compazine, Tylenol, acetazolamide, Benadryl, Advil, ketorolac injections, Goody powders, Robaxin, meloxicam, Benadryl, Reglan, Zofran and Compazine.  CT head 11/28/2022: CLINICAL DATA:  New onset headache.   EXAM: CT HEAD WITHOUT CONTRAST   TECHNIQUE: Contiguous axial images were obtained from the base of the skull through the vertex without intravenous contrast.   RADIATION DOSE REDUCTION: This exam was performed according to the departmental dose-optimization program which includes automated exposure control, adjustment of the mA and/or kV according to patient size and/or use of iterative reconstruction technique.   COMPARISON:  09/28/2022   FINDINGS: Brain: There is no evidence for acute hemorrhage, hydrocephalus, mass lesion, or abnormal extra-axial fluid collection. No definite CT evidence for acute infarction.   Vascular: No hyperdense vessel or unexpected calcification.   Skull: No evidence for fracture. No worrisome lytic or sclerotic lesion.   Sinuses/Orbits: The visualized paranasal sinuses and mastoid air cells are clear. Visualized portions of the globes and intraorbital fat are unremarkable.   Other: None.   IMPRESSION: No acute intracranial abnormality.  CTV: 11/28/2022:  FINDINGS: Superior sagittal sinus is patent and normal. Both transverse sinuses are patent with the right being dominant. Flow is visible in both sigmoid sinuses and internal jugular veins. Deep veins appear normal. No finding to suggest superficial thrombosis.   IMPRESSION: Normal CT venogram. No evidence of dural venous sinus thrombosis.

## 2022-12-03 NOTE — Telephone Encounter (Signed)
-----   Message from Melvenia Beam, MD sent at 12/03/2022  3:15 PM EST ----- Opening pressure was not that elevated at 22. Not really consistent with IDIOPATHIC INTRACRANIAL HYPERTENSION but we have to order you an MRi and have you see an eye doctor. I'll ask my office to get you back in here to discuss. Please see if we can move anything around at all, I asked Dr. Billey Gosling to see her tomorrow if she could to or take one of my botox - maybe an NP can take one of my botox tomorrow so I could see her?

## 2022-12-04 ENCOUNTER — Ambulatory Visit (INDEPENDENT_AMBULATORY_CARE_PROVIDER_SITE_OTHER): Payer: BC Managed Care – PPO | Admitting: Neurology

## 2022-12-04 ENCOUNTER — Other Ambulatory Visit: Payer: Self-pay | Admitting: Neurology

## 2022-12-04 ENCOUNTER — Encounter: Payer: Self-pay | Admitting: Neurology

## 2022-12-04 VITALS — BP 130/90 | HR 89 | Ht 67.0 in | Wt 234.0 lb

## 2022-12-04 DIAGNOSIS — G8929 Other chronic pain: Secondary | ICD-10-CM

## 2022-12-04 DIAGNOSIS — R51 Headache with orthostatic component, not elsewhere classified: Secondary | ICD-10-CM

## 2022-12-04 DIAGNOSIS — H4710 Unspecified papilledema: Secondary | ICD-10-CM

## 2022-12-04 DIAGNOSIS — G441 Vascular headache, not elsewhere classified: Secondary | ICD-10-CM

## 2022-12-04 DIAGNOSIS — R519 Headache, unspecified: Secondary | ICD-10-CM

## 2022-12-04 DIAGNOSIS — H547 Unspecified visual loss: Secondary | ICD-10-CM | POA: Diagnosis not present

## 2022-12-04 DIAGNOSIS — G932 Benign intracranial hypertension: Secondary | ICD-10-CM | POA: Diagnosis not present

## 2022-12-04 DIAGNOSIS — R7309 Other abnormal glucose: Secondary | ICD-10-CM

## 2022-12-04 DIAGNOSIS — R5383 Other fatigue: Secondary | ICD-10-CM

## 2022-12-04 DIAGNOSIS — H5713 Ocular pain, bilateral: Secondary | ICD-10-CM

## 2022-12-04 MED ORDER — WEGOVY 0.5 MG/0.5ML ~~LOC~~ SOAJ: 0.5000 mg | SUBCUTANEOUS | 1 refills | Status: DC

## 2022-12-04 MED ORDER — ACETAZOLAMIDE 250 MG PO TABS: 250.0000 mg | ORAL_TABLET | Freq: Two times a day (BID) | ORAL | 1 refills | Status: DC

## 2022-12-04 MED ORDER — ACETAZOLAMIDE ER 500 MG PO CP12: 500.0000 mg | ORAL_CAPSULE | Freq: Two times a day (BID) | ORAL | 6 refills | Status: DC

## 2022-12-04 NOTE — Patient Instructions (Addendum)
MRI brain and orbits Blood work Ophthalmology ASAP [facility]  [employer] - call me every month to increase the dose/mychart me   Idiopathic Intracranial Hypertension  Idiopathic intracranial hypertension (IIH) is a condition that increases pressure around the brain. The fluid that surrounds the brain and spinal cord (cerebrospinal fluid, or CSF) increases and causes the pressure. Idiopathic means that the cause of this condition is not known. IIH affects the brain and spinal cord. If this condition is not treated, it can cause vision loss or blindness. What are the causes? The cause of this condition is not known. What increases the risk? The following factors may make you more likely to develop this condition: Being obese. Being a person who is female, between the ages of 28 and 15 years old, and who has not gone through menopause. Taking certain medicines, such as birth control, acne medicines, or steroids. What are the signs or symptoms? Symptoms of this condition include: Headaches. This is the most common symptom. Brief periods of total blindness. Double vision, blurred vision, or poor side (peripheral) vision. Pain in the shoulders or neck. Nausea and vomiting. A sound like rushing water or a pulsing sound within the ears (pulsatile tinnitus), or ringing in the ears. How is this diagnosed? This condition may be diagnosed based on: Your symptoms and medical history. Imaging tests of the brain, such as: CT scan. MRI. Magnetic resonance venogram (MRV) to check the veins. Diagnostic lumbar puncture. This is a procedure to remove and examine a sample of CSF. This procedure can determine whether your fluid pressure is too high. An eye exam to check for swelling or nerve damage in the eyes. How is this treated? Treatment for this condition depends on the symptoms. The goal of treatment is to decrease the pressure around your brain. Common treatments  include: Weight loss through healthy eating, salt restriction, and exercise, if you are overweight. Medicines to decrease the production of CSF and lower the pressure within your skull. Medicines to prevent or treat headaches. Other treatments may include: Surgery to place drains (shunts) in your brain to remove extra fluid. Lumbar puncture to remove extra CSF. Follow these instructions at home: If you are overweight or obese, work with your health care provider to lose weight. Take over-the-counter and prescription medicines only as told by your health care provider. Ask your health care provider if the medicine prescribed to you requires you to avoid driving or using machinery. Do not use any products that contain nicotine or tobacco. These products include cigarettes, chewing tobacco, and vaping devices, such as e-cigarettes. If you need help quitting, ask your health care provider. Keep all follow-up visits. Your health care provider will need to monitor you regularly. Contact a health care provider if: You have changes in your vision, such as: Double vision. Blurred vision. Poor peripheral vision. Get help right away if: You have any of the following symptoms and they get worse or do not get better: Headaches. Nausea. Vomiting. Sudden trouble seeing. This information is not intended to replace advice given to you by your health care provider. Make sure you discuss any questions you have with your health care provider. Document Revised: 02/27/2022 Document Reviewed: 02/06/2022 Elsevier Patient Education  [place] Injection (Weight Management) What is this medication? SEMAGLUTIDE (SEM a GLOO tide) promotes weight loss. It may also be used to maintain weight loss. It works by decreasing appetite. Changes to diet and exercise are often combined  with this medication. This medicine may be used for other purposes; ask your health care provider or pharmacist if you  have questions. COMMON BRAND NAME(S): [id number] What should I tell my care team before I take this medication? They need to know if you have any of these conditions: Endocrine tumors (MEN 2) or if someone in your family had these tumors Eye disease, vision problems Gallbladder disease History of depression or mental health disease History of pancreatitis Kidney disease Stomach or intestine problems Suicidal thoughts, plans, or attempt; a previous suicide attempt by you or a family member Thyroid cancer or if someone in your family had thyroid cancer An unusual or allergic reaction to semaglutide, other medications, foods, dyes, or preservatives Pregnant or trying to get pregnant Breast-feeding How should I use this medication? This medication is injected under the skin. You will be taught how to prepare and give it. Take it as directed on the prescription label. It is given once every week (every 7 days). Keep taking it unless your care team tells you to stop. It is important that you put your used needles and pens in a special sharps container. Do not put them in a trash can. If you do not have a sharps container, call your pharmacist or care team to get one. A special MedGuide will be given to you by the pharmacist with each prescription and refill. Be sure to read this information carefully each time. This medication comes with INSTRUCTIONS FOR USE. Ask your pharmacist for directions on how to use this medication. Read the information carefully. Talk to your pharmacist or care team if you have questions. Talk to your care team about the use of this medication in children. While it may be prescribed for children as young as 12 years for selected conditions, precautions do apply. Overdosage: If you think you have taken too much of this medicine contact a poison control center or emergency room at once. NOTE: This medicine is only for you. Do not share this medicine with others. What if I miss a  dose? If you miss a dose and the next scheduled dose is more than 2 days away, take the missed dose as soon as possible. If you miss a dose and the next scheduled dose is less than 2 days away, do not take the missed dose. Take the next dose at your regular time. Do not take double or extra doses. If you miss your dose for 2 weeks or more, take the next dose at your regular time or call your care team to talk about how to restart this medication. What may interact with this medication? Insulin and other medications for diabetes This list may not describe all possible interactions. Give your health care provider a list of all the medicines, herbs, non-prescription drugs, or dietary supplements you use. Also tell them if you smoke, drink alcohol, or use illegal drugs. Some items may interact with your medicine. What should I watch for while using this medication? Visit your care team for regular checks on your progress. It may be some time before you see the benefit from this medication. Drink plenty of fluids while taking this medication. Check with your care team if you have severe diarrhea, nausea, and vomiting, or if you sweat a lot. The loss of too much body fluid may make it dangerous for you to take this medication. This medication may affect blood sugar levels. Ask your care team if changes in diet or medications are needed  if you have diabetes. If you or your family notice any changes in your behavior, such as new or worsening depression, thoughts of harming yourself, anxiety, other unusual or disturbing thoughts, or memory loss, call your care team right away. Women should inform their care team if they wish to become pregnant or think they might be pregnant. Losing weight while pregnant is not advised and may cause harm to the unborn child. Talk to your care team for more information. What side effects may I notice from receiving this medication? Side effects that you should report to your care  team as soon as possible: Allergic reactions--skin rash, itching, hives, swelling of the face, lips, tongue, or throat Change in vision Dehydration--increased thirst, dry mouth, feeling faint or lightheaded, headache, dark yellow or [place] urine Gallbladder problems--severe stomach pain, nausea, vomiting, fever Heart palpitations--rapid, pounding, or irregular heartbeat Kidney injury--decrease in the amount of urine, swelling of the ankles, hands, or feet Pancreatitis--severe stomach pain that spreads to your back or gets worse after eating or when touched, fever, nausea, vomiting Thoughts of suicide or self-harm, worsening mood, feelings of depression Thyroid cancer--new mass or lump in the neck, pain or trouble swallowing, trouble breathing, hoarseness Side effects that usually do not require medical attention (report to your care team if they continue or are bothersome): Diarrhea Loss of appetite Nausea Stomach pain Vomiting This list may not describe all possible side effects. Call your doctor for medical advice about side effects. You may report side effects to FDA at 1-800-FDA-1088. Where should I keep my medication? Keep out of the reach of children and pets. Refrigeration (preferred): Store in the refrigerator. Do not freeze. Keep this medication in the original container until you are ready to take it. Get rid of any unused medication after the expiration date. Room temperature: If needed, prior to cap removal, the pen can be stored at room temperature for up to 28 days. Protect from light. If it is stored at room temperature, get rid of any unused medication after 28 days or after it expires, whichever is first. It is important to get rid of the medication as soon as you no longer need it or it is expired. You can do this in two ways: Take the medication to a medication take-back program. Check with your pharmacy or law enforcement to find a location. If you cannot return the  medication, follow the directions in the [place]. NOTE: This sheet is a summary. It may not cover all possible information. If you have questions about this medicine, talk to your doctor, pharmacist, or health care provider.  2023 Elsevier/Gold Standard (2020-12-15 00:00:00) Acetazolamide Tablets What is this medication? ACETAZOLAMIDE (a set a ZOLE a mide) reduces swelling related to heart disease. It may also be used to reduce swelling caused by medications. It helps your kidneys remove more fluid and salt from your blood through the urine. It may also be used to treat conditions with increased pressure of the eye, such as glaucoma. It can be used with other medications to prevent and control seizures in people with epilepsy. It can also be used to prevent or treat symptoms of altitude sickness. It works by increasing the amount of oxygen in your body. It belongs to a group of medications called diuretics. This medicine may be used for other purposes; ask your health care provider or pharmacist if you have questions. COMMON BRAND NAME(S): Diamox What should I tell my care team before I take this medication? They need  to know if you have any of these conditions: Glaucoma Kidney disease Liver disease Low adrenal gland function Lung or breathing disease An unusual or allergic reaction to acetazolamide, other medications, foods, dyes, or preservatives Pregnant or trying to get pregnant Breast-feeding How should I use this medication? Take this medication by mouth. Take it as directed on the prescription label at the same time every day. You can take it with or without food. If it upsets your stomach, take it with food. Keep taking it unless your care team tells you to stop. Talk to your care team about the use of this medication in children. Special care may be needed. Overdosage: If you think you have taken too much of this medicine contact a poison control center or emergency room at  once. NOTE: This medicine is only for you. Do not share this medicine with others. What if I miss a dose? If you miss a dose, take it as soon as you can. If it is almost time for your next dose, take only that dose. Do not take double or extra doses. What may interact with this medication? Do not take this medication with any of the following: Methazolamide This medication may also interact with the following: Aspirin and aspirin-like medications Cyclosporine Lithium Medication for diabetes Methenamine Other diuretics Phenytoin Primidone Quinidine Sodium bicarbonate Stimulant medications, such as dextroamphetamine This list may not describe all possible interactions. Give your health care provider a list of all the medicines, herbs, non-prescription drugs, or dietary supplements you use. Also tell them if you smoke, drink alcohol, or use illegal drugs. Some items may interact with your medicine. What should I watch for while using this medication? Visit your care team for regular checks on your progress. Tell your care team if your symptoms do not start to get better or if they get worse. This medication may cause serious skin reactions. They can happen weeks to months after starting the medication. Contact your care team right away if you notice fevers or flu-like symptoms with a rash. The rash may be red or purple and then turn into blisters or peeling of the skin. Or, you might notice a red rash with swelling of the face, lips, or lymph nodes in your neck or under your arms. This medication may affect your coordination, reaction time, or judgment. Do not drive or operate machinery until you know how this medication affects you. Sit up or stand slowly to reduce the risk of dizzy or fainting spells. What side effects may I notice from receiving this medication? Side effects that you should report to your care team as soon as possible: Allergic reactions--skin rash, itching, hives, swelling  of the face, lips, tongue, or throat Aplastic anemia--unusual weakness or fatigue, dizziness, headache, trouble breathing, increased bleeding or bruising High acid level--trouble breathing, unusual weakness or fatigue, confusion, headache, fast or irregular heartbeat, nausea, vomiting Infection--fever, chills, cough, or sore throat Kidney stones--blood in the urine, pain or trouble passing urine, pain in the lower back or sides Liver injury--right upper belly pain, loss of appetite, nausea, light-colored stool, dark yellow or [place] urine, yellowing skin or eyes, unusual weakness or fatigue Low potassium level--muscle pain or cramps, unusual weakness or fatigue, fast or irregular heartbeat, constipation Redness, blistering, peeling, or loosening of the skin, including inside the mouth Side effects that usually do not require medical attention (report to your care team if they continue or are bothersome): Blurry vision Change in taste Loss of appetite Pain, tingling,  or numbness in the hands or feet This list may not describe all possible side effects. Call your doctor for medical advice about side effects. You may report side effects to FDA at 1-800-FDA-1088. Where should I keep my medication? Keep out of the reach of children and pets. Store at room temperature between 20 and 25 degrees C (68 and 77 degrees F). Throw away any unused medication after the expiration date. NOTE: This sheet is a summary. It may not cover all possible information. If you have questions about this medicine, talk to your doctor, pharmacist, or health care provider.  2023 Elsevier/Gold Standard (2021-11-10 00:00:00)

## 2022-12-04 NOTE — Telephone Encounter (Signed)
Spoke with patient and discussed LP results as noted below by Dr Jaynee Eagles. Patient will come today for 3 pm appt to see Dr Jaynee Eagles.

## 2022-12-04 NOTE — Progress Notes (Signed)
Provider:  Dr Jaynee Eagles Requesting Provider: Margarita Mail, PA-C Primary Care Provider:  Lorenda Hatchet, FNP  CC:  headache  HPI:  Krista Crosby is a 47 y.o. female here as requested by Margarita Mail, PA-C for hospital follow up for headache and questioned IDIOPATHIC INTRACRANIAL HYPERTENSION.  She has a past medical history of obstructive sleep apnea, seizure disorder, obesity, abscess of right leg, sepsis.  I reviewed emergency room notes from 09/30/2023 where she presented with a severe headache.  She complained of persistent frequent headaches for the past few months, taking BC powder every day to try to help with the headaches, sometimes multiple times a day, worse with bright lights and loud noises, no fever numbness or weakness to the arms or legs.  A migraine cocktail helped.  We also considered rebound headaches and talk to her about it.  CT head was negative for acute process.  She was seen back in the emergency room November 28, 2022 with repeat CT head and CTV which were again negative for anything acute.  Patient reported headaches again, blurry vision, global severe headaches, light sensitivity, ongoing 3 months daily, no other focal neurologic deficits or vision changes.  Physical and neurologic exam are normal.  LP was attempted but unsuccessful.  Ultrasound in ED ocular showed that the right eye showed increased optic nerve sheath diameter 6 mm as did the left at 7 mm.  Given her body habitus and age IIH was a possibility.  Again migraine cocktail with significant improvement.  They discharged her on acetazolamide 250 mg twice daily.   Migraine cocktails help. She has had headaches 4 months in the setting of some mild weight gain, worsening, band around her head, opening pressure. Opening pressure was only 22 however was on diamox and feels significantly improved after LP with fluid removal. Immediate relief. Today she looks tremendously beter. Still having headache but she can open  eyes, vision still blurry but so much better. Opening pressure was only 22 however was on diamox and feels significantly improved after LP with fluid removal. Still some blurry vision, still some pressure headache but yesterday 10/10 mayne 3/10, discussed IDIOPATHIC INTRACRANIAL HYPERTENSION, light still bother her not like before. Uses cpap every night. No other focal neurologic deficits, associated symptoms, inciting events or modifiable factors. No other focal neurologic deficits, associated symptoms, inciting events or modifiable factors. No hx of migraines.   Reviewed notes, labs and imaging from outside physicians, which showed:  From a thorough review of records, medications tried include meloxicam, methocarbamol, Compazine, Tylenol, acetazolamide, Benadryl, Advil, ketorolac injections, Goody powders, Robaxin, meloxicam, Benadryl, Reglan, Zofran and Compazine. Topiramate contraindicated as it has the same mechanism as diamox and can cause acidosis together, amitriptyline, aimovig contraindicated due to constipation, metformin  CT head 11/28/2022: CLINICAL DATA:  New onset headache.   EXAM: CT HEAD WITHOUT CONTRAST   TECHNIQUE: Contiguous axial images were obtained from the base of the skull through the vertex without intravenous contrast.   RADIATION DOSE REDUCTION: This exam was performed according to the departmental dose-optimization program which includes automated exposure control, adjustment of the mA and/or kV according to patient size and/or use of iterative reconstruction technique.   COMPARISON:  09/28/2022   FINDINGS: Brain: There is no evidence for acute hemorrhage, hydrocephalus, mass lesion, or abnormal extra-axial fluid collection. No definite CT evidence for acute infarction.   Vascular: No hyperdense vessel or unexpected calcification.   Skull: No evidence for fracture. No worrisome lytic or sclerotic lesion.  Sinuses/Orbits: The visualized paranasal sinuses  and mastoid air cells are clear. Visualized portions of the globes and intraorbital fat are unremarkable.   Other: None.   IMPRESSION: No acute intracranial abnormality.  CTV: 11/28/2022:  FINDINGS: Superior sagittal sinus is patent and normal. Both transverse sinuses are patent with the right being dominant. Flow is visible in both sigmoid sinuses and internal jugular veins. Deep veins appear normal. No finding to suggest superficial thrombosis.   IMPRESSION: Normal CT venogram. No evidence of dural venous sinus thrombosis.      Social History   Socioeconomic History   Marital status: Single    Spouse name: Not on file   Number of children: Not on file   Years of education: Not on file   Highest education level: Not on file  Occupational History   Not on file  Tobacco Use   Smoking status: Former   Smokeless tobacco: Never  Substance and Sexual Activity   Alcohol use: Yes    Comment: socially   Drug use: No   Sexual activity: Not on file  Other Topics Concern   Not on file  Social History Narrative   Not on file   Social Determinants of Health   Financial Resource Strain: Not on file  Food Insecurity: Not on file  Transportation Needs: Not on file  Physical Activity: Not on file  Stress: Not on file  Social Connections: Not on file  Intimate Partner Violence: Not on file    Family History  Problem Relation Age of Onset   Migraines Neg Hx    Headache Neg Hx     Past Medical History:  Diagnosis Date   Seizures ([place])     Patient Active Problem List   Diagnosis Date Noted   IIH (idiopathic intracranial hypertension) 12/04/2022   Sepsis ([place]) 07/21/2020   Seizure disorder ([place]) 07/21/2020   OSA (obstructive sleep apnea) 07/21/2020   Obesity (BMI 30-39.9) 07/21/2020   Abscess of right leg 07/21/2020    Past Surgical History:  Procedure Laterality Date   FOOT SURGERY     LUMBAR PUNCTURE  12/03/2022    Current Outpatient Medications   Medication Sig Dispense Refill   acetaZOLAMIDE ER (DIAMOX) 500 MG capsule Take 1 capsule (500 mg total) by mouth 2 (two) times daily. 180 capsule 6   diclofenac Sodium (VOLTAREN) 1 % GEL Apply 4 g topically 4 (four) times daily. 50 g 0   ibuprofen (MOTRIN IB) 200 MG tablet Take 1 tablet (200 mg total) by mouth every 6 (six) hours as needed for fever or moderate pain.     meloxicam (MOBIC) 15 MG tablet Take 1 tablet (15 mg total) by mouth daily. 10 tablet 0   methocarbamol (ROBAXIN) 500 MG tablet Take 1 tablet (500 mg total) by mouth 2 (two) times daily. 20 tablet 0   mupirocin nasal ointment (BACTROBAN) 2 % Apply in each nostril daily 1 g 0   omeprazole (PRILOSEC OTC) 20 MG tablet Take 1 tablet (20 mg total) by mouth daily. 10 tablet    prochlorperazine (COMPAZINE) 10 MG tablet Take 1 tablet (10 mg total) by mouth 2 (two) times daily as needed (Headache). 20 tablet 0   Semaglutide-Weight Management (WEGOVY) 0.5 MG/0.5ML SOAJ Inject 0.5 mg into the skin once a week. COPAY CARD: BIN [id number] PCN [id number] GRP [id number] ID [id number] 2 mL 1   No current facility-administered medications for this visit.    Allergies as of 12/04/2022   (No Known Allergies)  Vitals: BP (!) 130/90 (BP Location: Right Arm, Patient Position: Sitting, Cuff Size: Large)   Pulse 89   Ht 5' 7"$  (1.702 m)   Wt 234 lb (106.1 kg)   BMI 36.65 kg/m  Last Weight:  Wt Readings from Last 1 Encounters:  12/04/22 234 lb (106.1 kg)   Last Height:   Ht Readings from Last 1 Encounters:  12/04/22 5' 7"$  (1.702 m)     Physical exam: Exam: Gen: NAD, conversant, well nourised, obese, well groomed                     CV: RRR, no MRG. No Carotid Bruits. No peripheral edema, warm, nontender Eyes: Conjunctivae clear without exudates or hemorrhage  Neuro: Detailed Neurologic Exam  Speech:    Speech is normal; fluent and spontaneous with normal comprehension.  Cognition:    The patient is oriented to person, place, and time;      recent and remote memory intact;     language fluent;     normal attention, concentration,     fund of knowledge Cranial Nerves:    The pupils are equal, round, and reactive to light. ONH elevated?Extraocular movements are intact. Trigeminal sensation is intact and the muscles of mastication are normal. The face is symmetric. The palate elevates in the midline. Hearing intact. Voice is normal. Shoulder shrug is normal. The tongue has normal motion without fasciculations.   Coordination:    Normal finger to nose and heel to shin. Normal rapid alternating movements.   Gait:    Heel-toe and tandem gait are normal.   Motor Observation:    No asymmetry, no atrophy, and no involuntary movements noted. Tone:    Normal muscle tone.    Posture:    Posture is normal. normal erect    Strength:    Strength is V/V in the upper and lower limbs.      Sensation: intact to LT     Reflex Exam:  DTR's:    Deep tendon reflexes in the upper and lower extremities are normal bilaterally.   Toes:    The toes are downgoing bilaterally.   Clonus:    Clonus is absent.    Assessment/Plan:  47 y.o. female here as requested by Margarita Mail, PA-C for hospital follow up for headache and questioned IDIOPATHIC INTRACRANIAL HYPERTENSION.  She has a past medical history of obstructive sleep apnea, seizure disorder, morbid obesity, abscess of right leg, sepsis.  I reviewed emergency room notes from 09/30/2023 where she presented with a severe headache.  She complained of persistent frequent headaches for the past few months, taking BC powder every day to try to help with the headaches, sometimes multiple times a day, worse with bright lights and loud noises, no fever numbness or weakness to the arms or legs.  A migraine cocktail helped.  They also considered rebound headaches and talk to her about it.  CT head was negative for acute process.  She was seen back in the emergency room November 28, 2022 with  repeat CT head and CTV which were again negative for anything acute.  Patient reported headaches again, blurry vision, global severe headaches, light sensitivity, ongoing 3 months daily, no other focal neurologic deficits, reported vision changes.  Physical and neurologic exam were normal.  LP was attempted but unsuccessful so they just started her on diamox.  Ultrasound in ED ocular showed that the right eye showed increased optic nerve sheath diameter 6 mm as did the  left at 7 mm.  Given her body habitus and age IIH was a possibility.  Again migraine cocktail with significant improvement.  They discharged her on acetazolamide 250 mg twice daily.  significantly improved after LP with fluid removal  - ED discharged her on 243m diamox without an opening pressure. We sent her asap to [facility], only 22 but was already on diamox. significantly improved after LP with fluid removal - discussed daily goody powder and rebound headache, stop - order MRI Brain w/wo contrast: MRI brain due to concerning symptoms of morning headaches, positional headaches,vision changes, papilledema, diplopia  to look for space occupying mass, compressive mass, chiari or intracranial hypertension (pseudotumor) or other intracranial etiology. - MRI Orbits: due to papilledema,eye pain on movement, diplopia, vision changes, to look for compressive cavernous lesions, orbital pseudotumor - Lumbar puncture for opening pressure and labs: OP 22 but was already on diamox. Ultrasound in ED ocular showed that the right eye showed increased optic nerve sheath diameter 6 mm as did the left at 7 mm.  significantly improved after LP with fluid removal - Ophthalmology referral stat if possible. (Was in severe pain yesterday prior to LP, had lights off, tearing up) -Weight loss is critical, discussed weight loss and risk of permanent vision loss - Remove and intrauterine IUD if you have one - For any acute change especially worsening  headache or vision loss call 911 and proceed to ED - continue diamox increase 5063mBID - weight loss critical start Wegovy - has been in a weight loss progran for > 3 months and unable to lose 5% or body weight, tried metformin, prescribe Wegovy  To prevent or relieve headaches, try the following: Cool Compress. Lie down and place a cool compress on your head.  Avoid headache triggers. If certain foods or odors seem to have triggered your migraines in the past, avoid them. A headache diary might help you identify triggers.  Include physical activity in your daily routine. Try a daily walk or other moderate aerobic exercise.  Manage stress. Find healthy ways to cope with the stressors, such as delegating tasks on your to-do list.  Practice relaxation techniques. Try deep breathing, yoga, massage and visualization.  Eat regularly. Eating regularly scheduled meals and maintaining a healthy diet might help prevent headaches. Also, drink plenty of fluids.  Follow a regular sleep schedule. Sleep deprivation might contribute to headaches Consider biofeedback. With this mind-body technique, you learn to control certain bodily functions -- such as muscle tension, heart rate and blood pressure -- to prevent headaches or reduce headache pain.    Proceed to emergency room if you experience new or worsening symptoms or symptoms do not resolve, if you have new neurologic symptoms or if headache is severe, or for any concerning symptom.   Provided education and documentation from American headache Society toolbox including articles on: pseudotumoer cerebri(IIH), chronic migraine medication overuse headache, chronic migraines, prevention of migraines and other headaches, behavioral and other nonpharmacologic treatments for headache.   Orders Placed This Encounter  Procedures   MR BRAIN W WO CONTRAST   MR ORBITS W WO CONTRAST   TSH Rfx on Abnormal to Free T4   Hemoglobin A1c   Ambulatory referral to  Ophthalmology   [facility]  Medications   DISCONTD: acetaZOLAMIDE (DIAMOX) 250 MG tablet    Sig: Take 1 tablet (250 mg total) by mouth 2 (two) times daily.    Dispense:  180  tablet    Refill:  1   acetaZOLAMIDE ER (DIAMOX) 500 MG capsule    Sig: Take 1 capsule (500 mg total) by mouth 2 (two) times daily.    Dispense:  180 capsule    Refill:  6    Discontinue diamox 240m please.   Semaglutide-Weight Management (WEGOVY) 0.5 MG/0.5ML SOAJ    Sig: Inject 0.5 mg into the skin once a week. COPAY CARD: BIN [id number] CNRX GRP [id number]   Dispense:  2 mL    Refill:  1    Cc: SLorenda Hatchet FNP,  SLorenda Hatchet FNP  [facility] [address] [address] Phone [phone] [phone]

## 2022-12-05 ENCOUNTER — Telehealth: Payer: Self-pay | Admitting: Neurology

## 2022-12-05 LAB — HEMOGLOBIN A1C
Est. average glucose Bld gHb Est-mCnc: 120 mg/dL
Hgb A1c MFr Bld: 5.8 % — ABNORMAL HIGH (ref 4.8–5.6)

## 2022-12-05 LAB — TSH RFX ON ABNORMAL TO FREE T4: TSH: 1.62 u[IU]/mL (ref 0.450–4.500)

## 2022-12-05 NOTE — Telephone Encounter (Signed)
Referral for Family Practice sent through [facility] to [facility] at [facility], Phone: [phone].

## 2022-12-05 NOTE — Telephone Encounter (Signed)
Referral for Ophthalmology fax to [facility]. Phone: [phone], [phone]

## 2022-12-06 ENCOUNTER — Telehealth: Payer: Self-pay | Admitting: Neurology

## 2022-12-06 NOTE — Telephone Encounter (Signed)
[employer] NPR via [place] sent to GI [phone]

## 2022-12-06 NOTE — Telephone Encounter (Signed)
Let patient know her insurance does not cover wegovy. If she likes I can refer her to the weight loss clinic, I am not aware of any other alternative for obesity but the weight loss center may be able to help thanks

## 2022-12-10 ENCOUNTER — Other Ambulatory Visit: Payer: Self-pay | Admitting: Neurology

## 2022-12-10 DIAGNOSIS — G932 Benign intracranial hypertension: Secondary | ICD-10-CM

## 2022-12-10 NOTE — Telephone Encounter (Signed)
Spoke pt will like referral to weight loss clinic Pt asked about MRI . Informed patient could take up to two  weeks for insurance to approve MRI  Pt expressed understanding and thanked me for calling

## 2022-12-10 NOTE — Telephone Encounter (Signed)
Referral placed have her call [phone] to make an appointment thanks

## 2022-12-27 ENCOUNTER — Encounter: Payer: BC Managed Care – PPO | Admitting: Nurse Practitioner

## 2023-01-15 ENCOUNTER — Encounter: Payer: Self-pay | Admitting: Neurology

## 2023-01-15 ENCOUNTER — Ambulatory Visit (INDEPENDENT_AMBULATORY_CARE_PROVIDER_SITE_OTHER): Payer: BC Managed Care – PPO | Admitting: Neurology

## 2023-01-15 VITALS — BP 127/83 | HR 72 | Ht 67.0 in | Wt 230.2 lb

## 2023-01-15 DIAGNOSIS — G932 Benign intracranial hypertension: Secondary | ICD-10-CM | POA: Diagnosis not present

## 2023-01-15 MED ORDER — PHENTERMINE HCL 15 MG PO CAPS: 15.0000 mg | ORAL_CAPSULE | ORAL | 3 refills | Status: DC

## 2023-01-15 NOTE — Patient Instructions (Addendum)
- Healthy weight and wellness - Call [phone] and get the MRI brain and MRI orbits - Phentermine 15mg  then call to increase to 30 then 37.5 but just a warning this is not a permanent fix and tis medication wears off and if you don't get into a weight loss program you gain the weight back afterwards. - lose weight - try to get you ozempic or mounjaro since hgba1c is abnormal  Phentermine Capsules or Tablets What is this medication? PHENTERMINE (FEN ter meen) promotes weight loss. It works by decreasing appetite. It is often used for a short period of time. Changes to diet and exercise are often combined with this medication. This medicine may be used for other purposes; ask your health care provider or pharmacist if you have questions. COMMON BRAND NAME(S): Adipex-P, Atti-Plex P, Atti-Plex P Spansule, Fastin, Lomaira, Pro-Fast, Pro-Fast HS, Pro-Fast SA, Tara-8 What should I tell my care team before I take this medication? They need to know if you have any of these conditions: Agitation or nervousness Diabetes Glaucoma Heart disease High blood pressure History of substance use disorder History of stroke Kidney disease Lung disease called Primary Pulmonary Hypertension (PPH) Taken an MAOI, such as Carbex, Eldepryl, Marplan, Nardil, or Parnate in last 14 days Taking stimulant medications for attention disorders, weight loss, or to stay awake Thyroid disease An unusual or allergic reaction to phentermine, other medications, foods, dyes, or preservatives Pregnant or trying to get pregnant Breastfeeding How should I use this medication? Take this medication by mouth with a glass of water. Follow the directions on the prescription label. Take your medication at regular intervals. Do not take it more often than directed. Do not stop taking except on your care team's advice. Talk to your care team about the use of this medication in children. While this medication may be prescribed for children  17 years or older for selected conditions, precautions do apply. Overdosage: If you think you have taken too much of this medicine contact a poison control center or emergency room at once. NOTE: This medicine is only for you. Do not share this medicine with others. What if I miss a dose? If you miss a dose, take it as soon as you can. If it is almost time for your next dose, take only that dose. Do not take double or extra doses. What may interact with this medication? Do not take this medication with any of the following: MAOIs, such as Carbex, Eldepryl, Marplan, Nardil, and Parnate This medication may also interact with the following: Alcohol Certain medications for depression, anxiety, or other mental health conditions Certain medications for blood pressure Linezolid Medications for colds or breathing difficulties, such as pseudoephedrine or phenylephrine Medications for diabetes Sibutramine Stimulant medications for ADHD, weight loss, or staying awake This list may not describe all possible interactions. Give your health care provider a list of all the medicines, herbs, non-prescription drugs, or dietary supplements you use. Also tell them if you smoke, drink alcohol, or use illegal drugs. Some items may interact with your medicine. What should I watch for while using this medication? Visit your care team for regular checks on your progress. Do not stop taking except on your care team's advice. You may develop a severe reaction. Your care team will tell you how much medication to take. Do not take this medication close to bedtime. It may prevent you from sleeping. This medication may affect your coordination, reaction time, or judgment. Do not drive or operate machinery until  you know how this medication affects you. Sit up or stand slowly to reduce the risk of dizzy or fainting spells. Drinking alcohol with this medication can increase the risk of these side effects. This medication may  affect blood sugar levels. Ask your care team if changes in diet or medications are needed if you have diabetes. Inform your care team if you wish to become pregnant or think you might be pregnant. Losing weight while pregnant is not advised and may cause harm to the unborn child. Talk to your care team for more information. What side effects may I notice from receiving this medication? Side effects that you should report to your care team as soon as possible: Allergic reactions--skin rash, itching, hives, swelling of the face, lips, tongue, or throat Heart valve disease--shortness of breath, chest pain, unusual weakness or fatigue, dizziness, feeling faint or lightheaded, fever, sudden weight gain, fast or irregular heartbeat Pulmonary hypertension--shortness of breath, chest pain, fast or irregular heartbeat, feeling faint or lightheaded, fatigue, swelling of the ankles or feet Side effects that usually do not require medical attention (report to your care team if they continue or are bothersome): Change in taste Diarrhea Dizziness Dry mouth Restlessness Trouble sleeping This list may not describe all possible side effects. Call your doctor for medical advice about side effects. You may report side effects to FDA at 1-800-FDA-1088. Where should I keep my medication? Keep out of the reach of children. This medication can be abused. Keep your medication in a safe place to protect it from theft. Do not share this medication with anyone. Selling or giving away this medication is dangerous and against the law. This medication may cause harm and death if it is taken by other adults, children, or pets. Return medication that has not been used to an official disposal site. Contact the DEA at (567)765-0737 or your city/county government to find a site. If you cannot return the medication, mix any unused medication with a substance like cat litter or coffee grounds. Then throw the medication away in a  sealed container like a sealed bag or coffee can with a lid. Do not use the medication after the expiration date. Store at room temperature between 20 and 25 degrees C (68 and 77 degrees F). Keep container tightly closed. NOTE: This sheet is a summary. It may not cover all possible information. If you have questions about this medicine, talk to your doctor, pharmacist, or health care provider.  2023 Elsevier/Gold Standard (2007-11-22 00:00:00)  Idiopathic Intracranial Hypertension  Idiopathic intracranial hypertension (IIH) is a condition that increases pressure around the brain. The fluid that surrounds the brain and spinal cord (cerebrospinal fluid, or CSF) increases and causes the pressure. Idiopathic means that the cause of this condition is not known. IIH affects the brain and spinal cord. If this condition is not treated, it can cause vision loss or blindness. What are the causes? The cause of this condition is not known. What increases the risk? The following factors may make you more likely to develop this condition: Being obese. Being a person who is female, between the ages of 37 and 72 years old, and who has not gone through menopause. Taking certain medicines, such as birth control, acne medicines, or steroids. What are the signs or symptoms? Symptoms of this condition include: Headaches. This is the most common symptom. Brief periods of total blindness. Double vision, blurred vision, or poor side (peripheral) vision. Pain in the shoulders or neck. Nausea and vomiting.  A sound like rushing water or a pulsing sound within the ears (pulsatile tinnitus), or ringing in the ears. How is this diagnosed? This condition may be diagnosed based on: Your symptoms and medical history. Imaging tests of the brain, such as: CT scan. MRI. Magnetic resonance venogram (MRV) to check the veins. Diagnostic lumbar puncture. This is a procedure to remove and examine a sample of CSF. This  procedure can determine whether your fluid pressure is too high. An eye exam to check for swelling or nerve damage in the eyes. How is this treated? Treatment for this condition depends on the symptoms. The goal of treatment is to decrease the pressure around your brain. Common treatments include: Weight loss through healthy eating, salt restriction, and exercise, if you are overweight. Medicines to decrease the production of CSF and lower the pressure within your skull. Medicines to prevent or treat headaches. Other treatments may include: Surgery to place drains (shunts) in your brain to remove extra fluid. Lumbar puncture to remove extra CSF. Follow these instructions at home: If you are overweight or obese, work with your health care provider to lose weight. Take over-the-counter and prescription medicines only as told by your health care provider. Ask your health care provider if the medicine prescribed to you requires you to avoid driving or using machinery. Do not use any products that contain nicotine or tobacco. These products include cigarettes, chewing tobacco, and vaping devices, such as e-cigarettes. If you need help quitting, ask your health care provider. Keep all follow-up visits. Your health care provider will need to monitor you regularly. Contact a health care provider if: You have changes in your vision, such as: Double vision. Blurred vision. Poor peripheral vision. Get help right away if: You have any of the following symptoms and they get worse or do not get better: Headaches. Nausea. Vomiting. Sudden trouble seeing. This information is not intended to replace advice given to you by your health care provider. Make sure you discuss any questions you have with your health care provider. Document Revised: 02/27/2022 Document Reviewed: 02/06/2022 Else[employer] Patient Education  [employer].

## 2023-01-15 NOTE — Progress Notes (Signed)
Provider:  Dr Jaynee Eagles Requesting Provider: Margarita Mail, PA-C Primary Care Provider:  Lorenda Hatchet, FNP  CC:  headache  01/15/2023: On diamox, she is feeling better.  CSF with an opening pressure of 22 cm water, measured in the left lateral decubitus position. 17 ml of CSF were obtained for laboratory studies. Closing pressure was 15 cm water. Started on the diamox, taking 3x a day. She is having tingling in the fingers and toes and thins taste flat but otherwise doing well. Lost 4 pounds.Her insurance would not pay for wegovy. They couldn't make it to the [facility] they need to reschedule. They have a follow up with Dr. Katy Fitch and planning to get new glasses. By the time she saw Dr. Katy Fitch was already on acetazolamide and the optic nerves were normal. "There is high water mark e.g. would suggest recent swelling"). No spontaneous venous pulsations only exam finding suggestive of ICP. And patient is feeling better, headaches are completely gone, if she takes the medicine the headache comes back. Also had she not been on treatment, 22 might have ben higher. Saw Dr. Katy Fitch last 12/13/22 and has a follow up 5-6 weeks (next is this month sometime) then per his notes will follow every 6 months. He could not get VA better than 20/60OD and 20/50 OS. Will treat will phentermine until [facility] Dr. Katy Fitch in ophtho could not get VA beter than 20/50 and 20/60 so will wait and try again and gave her eye drops. Reviewed HVF/OCT. 11/28/2022 CTV was normal, reviewed images and agree, IMPRESSION: Normal CT venogram. No evidence of dural venous sinus thrombosis. Mancel Parsons not paid for by her insurabce but her hgba1c is abnormal will try to order ozempic or mounjaro.  Patient complains of symptoms per HPI as well as the following symptoms: tingling . Pertinent negatives and positives per HPI. All others negative   HPI:  Krista Crosby is a 47 y.o. female here as  requested by Margarita Mail, PA-C for hospital follow up for headache and questioned IDIOPATHIC INTRACRANIAL HYPERTENSION.  She has a past medical history of obstructive sleep apnea, seizure disorder, obesity, abscess of right leg, sepsis.  I reviewed emergency room notes from 09/30/2023 where she presented with a severe headache.  She complained of persistent frequent headaches for the past few months, taking BC powder every day to try to help with the headaches, sometimes multiple times a day, worse with bright lights and loud noises, no fever numbness or weakness to the arms or legs.  A migraine cocktail helped.  We also considered rebound headaches and talk to her about it.  CT head was negative for acute process.  She was seen back in the emergency room November 28, 2022 with repeat CT head and CTV which were again negative for anything acute.  Patient reported headaches again, blurry vision, global severe headaches, light sensitivity, ongoing 3 months daily, no other focal neurologic deficits or vision changes.  Physical and neurologic exam are normal.  LP was attempted but unsuccessful.  Ultrasound in ED ocular showed that the right eye showed increased optic nerve sheath diameter 6 mm as did the left at 7 mm.  Given her body habitus and age IIH was a possibility.  Again migraine cocktail with significant improvement.  They discharged her on acetazolamide 250 mg twice daily.   Migraine cocktails help. She has had headaches 4 months in the setting of some mild weight gain, worsening, band around  her head, opening pressure. Opening pressure was only 22 however was on diamox and feels significantly improved after LP with fluid removal. Immediate relief. Today she looks tremendously beter. Still having headache but she can open eyes, vision still blurry but so much better. Opening pressure was only 22 however was on diamox and feels significantly improved after LP with fluid removal. Still some blurry vision,  still some pressure headache but yesterday 10/10 mayne 3/10, discussed IDIOPATHIC INTRACRANIAL HYPERTENSION, light still bother her not like before. Uses cpap every night. No other focal neurologic deficits, associated symptoms, inciting events or modifiable factors. No other focal neurologic deficits, associated symptoms, inciting events or modifiable factors. No hx of migraines.   Reviewed notes, labs and imaging from outside physicians, which showed:  From a thorough review of records, medications tried include meloxicam, methocarbamol, Compazine, Tylenol, acetazolamide, Benadryl, Advil, ketorolac injections, Goody powders, Robaxin, meloxicam, Benadryl, Reglan, Zofran and Compazine. Topiramate contraindicated as it has the same mechanism as diamox and can cause acidosis together, amitriptyline, aimovig contraindicated due to constipation, metformin  CT head 11/28/2022: CLINICAL DATA:  New onset headache.   EXAM: CT HEAD WITHOUT CONTRAST   TECHNIQUE: Contiguous axial images were obtained from the base of the skull through the vertex without intravenous contrast.   RADIATION DOSE REDUCTION: This exam was performed according to the departmental dose-optimization program which includes automated exposure control, adjustment of the mA and/or kV according to patient size and/or use of iterative reconstruction technique.   COMPARISON:  09/28/2022   FINDINGS: Brain: There is no evidence for acute hemorrhage, hydrocephalus, mass lesion, or abnormal extra-axial fluid collection. No definite CT evidence for acute infarction.   Vascular: No hyperdense vessel or unexpected calcification.   Skull: No evidence for fracture. No worrisome lytic or sclerotic lesion.   Sinuses/Orbits: The visualized paranasal sinuses and mastoid air cells are clear. Visualized portions of the globes and intraorbital fat are unremarkable.   Other: None.   IMPRESSION: No acute intracranial abnormality.  CTV:  11/28/2022:  FINDINGS: Superior sagittal sinus is patent and normal. Both transverse sinuses are patent with the right being dominant. Flow is visible in both sigmoid sinuses and internal jugular veins. Deep veins appear normal. No finding to suggest superficial thrombosis.   IMPRESSION: Normal CT venogram. No evidence of dural venous sinus thrombosis.      Social History   Socioeconomic History   Marital status: Single    Spouse name: Not on file   Number of children: Not on file   Years of education: Not on file   Highest education level: Not on file  Occupational History   Not on file  Tobacco Use   Smoking status: Former   Smokeless tobacco: Never  Substance and Sexual Activity   Alcohol use: Yes    Comment: socially   Drug use: No   Sexual activity: Not on file  Other Topics Concern   Not on file  Social History Narrative   Not on file   Social Determinants of Health   Financial Resource Strain: Not on file  Food Insecurity: Not on file  Transportation Needs: Not on file  Physical Activity: Not on file  Stress: Not on file  Social Connections: Not on file  Intimate Partner Violence: Not on file    Family History  Problem Relation Age of Onset   Migraines Neg Hx    Headache Neg Hx     Past Medical History:  Diagnosis Date   Seizures  Patient Active Problem List   Diagnosis Date Noted   IIH (idiopathic intracranial hypertension) 12/04/2022   Sepsis 07/21/2020   Seizure disorder 07/21/2020   OSA (obstructive sleep apnea) 07/21/2020   Obesity (BMI 30-39.9) 07/21/2020   Abscess of right leg 07/21/2020    Past Surgical History:  Procedure Laterality Date   FOOT SURGERY     LUMBAR PUNCTURE  12/03/2022    Current Outpatient Medications  Medication Sig Dispense Refill   acetaZOLAMIDE ER (DIAMOX) 500 MG capsule Take 1 capsule (500 mg total) by mouth 2 (two) times daily. 180 capsule 6   diclofenac Sodium (VOLTAREN) 1 % GEL Apply 4 g topically 4  (four) times daily. 50 g 0   ibuprofen (MOTRIN IB) 200 MG tablet Take 1 tablet (200 mg total) by mouth every 6 (six) hours as needed for fever or moderate pain.     meloxicam (MOBIC) 15 MG tablet Take 1 tablet (15 mg total) by mouth daily. 10 tablet 0   methocarbamol (ROBAXIN) 500 MG tablet Take 1 tablet (500 mg total) by mouth 2 (two) times daily. 20 tablet 0   mupirocin nasal ointment (BACTROBAN) 2 % Apply in each nostril daily 1 g 0   omeprazole (PRILOSEC OTC) 20 MG tablet Take 1 tablet (20 mg total) by mouth daily. 10 tablet    phentermine 15 MG capsule Take 1 capsule (15 mg total) by mouth every morning. 30 capsule 3   prochlorperazine (COMPAZINE) 10 MG tablet Take 1 tablet (10 mg total) by mouth 2 (two) times daily as needed (Headache). 20 tablet 0   Semaglutide-Weight Management (WEGOVY) 0.5 MG/0.5ML SOAJ Inject 0.5 mg into the skin once a week. COPAY CARD: BIN [id number] PCN [id number] GRP [id number] ID [id number] (Patient not taking: Reported on 01/15/2023) 2 mL 1   No current facility-administered medications for this visit.    Allergies as of 01/15/2023   (No Known Allergies)    Vitals: BP 127/83   Pulse 72   Ht 5\' 7"  (1.702 m)   Wt 230 lb 3.2 oz (104.4 kg)   BMI 36.05 kg/m  Last Weight:  Wt Readings from Last 1 Encounters:  01/15/23 230 lb 3.2 oz (104.4 kg)   Last Height:   Ht Readings from Last 1 Encounters:  01/15/23 5\' 7"  (1.702 m)   Physical exam: Exam: Gen: NAD, conversant, well nourised, obese, well groomed                     CV: RRR, no MRG. No Carotid Bruits. No peripheral edema, warm, nontender Eyes: Conjunctivae clear without exudates or hemorrhage  Neuro: Detailed Neurologic Exam  Speech:    Speech is normal; fluent and spontaneous with normal comprehension.  Cognition:    The patient is oriented to person, place, and time;     recent and remote memory intact;     language fluent;     normal attention, concentration,     fund of knowledge Cranial  Nerves:    The pupils are equal, round, and reactive to light. The fundi are normal and spontaneous venous pulsations are present. Visual fields are full to finger confrontation. Extraocular movements are intact. Trigeminal sensation is intact and the muscles of mastication are normal. The face is symmetric. The palate elevates in the midline. Hearing intact. Voice is normal. Shoulder shrug is normal. The tongue has normal motion without fasciculations.   Coordination:    Normal finger to nose and heel to shin. Normal rapid alternating movements.  Gait:    Heel-toe and tandem gait are normal.   Motor Observation:    No asymmetry, no atrophy, and no involuntary movements noted. Tone:    Normal muscle tone.    Posture:    Posture is normal. normal erect    Strength:    Strength is V/V in the upper and lower limbs.      Sensation: intact to LT     Reflex Exam:  DTR's:    Deep tendon reflexes in the upper and lower extremities are normal bilaterally.   Toes:    The toes are downgoing bilaterally.   Clonus:    Clonus is absent.    Assessment/Plan:  47 y.o. female here as requested by Margarita Mail, PA-C for hospital follow up for headache and questioned IDIOPATHIC INTRACRANIAL HYPERTENSION.  She has a past medical history of obstructive sleep apnea, seizure disorder, morbid obesity, abscess of right leg, sepsis.  I reviewed emergency room notes from 09/30/2023 where she presented with a severe headache.  She complained of persistent frequent headaches for the past few months, taking BC powder every day to try to help with the headaches, sometimes multiple times a day, worse with bright lights and loud noises, no fever numbness or weakness to the arms or legs.  A migraine cocktail helped.  They also considered rebound headaches and talk to her about it.  CT head was negative for acute process.  She was seen back in the emergency room November 28, 2022 with repeat CT head and CTV which  were again negative for anything acute.  Patient reported headaches again, blurry vision, global severe headaches, light sensitivity, ongoing 3 months daily, no other focal neurologic deficits, reported vision changes.  Physical and neurologic exam were normal.  LP was attempted but unsuccessful so they just started her on diamox.  Ultrasound in ED ocular showed that the right eye showed increased optic nerve sheath diameter 6 mm as did the left at 7 mm.  Given her body habitus and age IIH was a possibility.  Again migraine cocktail with significant improvement.  They discharged her on acetazolamide 250 mg twice daily.  significantly improved after LP with fluid removal  - ED discharged her on 250mg  diamox without an opening pressure. We sent her asap to [facility], only 22 but was already on diamox. Also had she not been on treatment, 22 might have ben higher. significantly improved after LP with fluid removal, - On diamox, she is feeling better.   - By the time she saw Dr. Katy Fitch 12/13/2022 ophthalmology was already on acetazolamide and the optic nerves were normal BUT Per Dr. Wyatt Portela "There is high water mark e.g. would suggest recent swelling.  No spontaneous venous pulsations only exam finding suggestive of ICP." Dr. Katy Fitch in ophtho could not get VA beter than 20/50 and 20/60 so will wait and try again and gave her eye drops. Reviewed HVF/OCT. 11/28/2022  - patient is feeling better, headaches are completely gone, if she takes the medicine the headache comes back.  - Will treat will phentermine until [facility]; Phentermine 15mg  then call to increase to 30 then 37.5 but just a warning this is not a permanent fix and tis medication wears off and if you don't get into a weight loss program you gain the weight back afterwards. - CTV was normal, reviewed images and agree, Normal CT venogram. No evidence of dural venous sinus thrombosis. Wegovy not paid for  by her  insurabce but her hgba1c is abnormal will try to order ozempic or mounjaro and get her back to the weight loss center - discussed daily goody powder and rebound headache, stop - SHE DID NOT GET MRI BRAIN/ORBITS - GAVE HER NUMBER TO CALL AND SCHEDULE: order MRI Brain w/wo contrast: MRI brain due to concerning symptoms of morning headaches, positional headaches,vision changes, papilledema, diplopia  to look for space occupying mass, compressive mass, chiari or intracranial hypertension (pseudotumor) or other intracranial etiology.- MRI Orbits: due to papilledema,eye pain on movement, diplopia, vision changes, to look for compressive cavernous lesions, orbital pseudotumor -- Lumbar puncture for opening pressure and labs: OP 22 but was already on diamox. Ultrasound in ED ocular showed that the right eye showed increased optic nerve sheath diameter 6 mm as did the left at 7 mm.  significantly improved after LP with fluid removal - Weight loss is critical, discussed weight loss and risk of permanent vision loss - Remove and intrauterine IUD if you have one - For any acute change especially worsening headache or vision loss call 911 and proceed to ED - continue diamox increase 500mg  TID - weight loss critical, insurance does not pay for wegovy, will try ozempic has abnormal hgba1c  Discussed above and additional Patient instructions:  - [facility] - Call [phone] and get the MRI brain and MRI orbits - Phentermine 15mg  then call to increase to 30 then 37.5 but just a warning this is not a permanent fix and tis medication wears off and if you don't get into a weight loss program you gain the weight back afterwards. - lose weight - try to get you ozempic or mounjaro since hgba1c is abnormal  To prevent or relieve headaches, try the following: Cool Compress. Lie down and place a cool compress on your head.  Avoid headache triggers. If certain foods or odors seem to have triggered your  migraines in the past, avoid them. A headache diary might help you identify triggers.  Include physical activity in your daily routine. Try a daily walk or other moderate aerobic exercise.  Manage stress. Find healthy ways to cope with the stressors, such as delegating tasks on your to-do list.  Practice relaxation techniques. Try deep breathing, yoga, massage and visualization.  Eat regularly. Eating regularly scheduled meals and maintaining a healthy diet might help prevent headaches. Also, drink plenty of fluids.  Follow a regular sleep schedule. Sleep deprivation might contribute to headaches Consider biofeedback. With this mind-body technique, you learn to control certain bodily functions -- such as muscle tension, heart rate and blood pressure -- to prevent headaches or reduce headache pain.    Proceed to emergency room if you experience new or worsening symptoms or symptoms do not resolve, if you have new neurologic symptoms or if headache is severe, or for any concerning symptom.   Provided education and documentation from [employer] toolbox including articles on: pseudotumoer cerebri(IIH), chronic migraine medication overuse headache, chronic migraines, prevention of migraines and other headaches, behavioral and other nonpharmacologic treatments for headache.   Orders Placed This Encounter  Procedures   Comprehensive metabolic panel   CBC with Differential/Platelets   Meds ordered this encounter  Medications   phentermine 15 MG capsule    Sig: Take 1 capsule (15 mg total) by mouth every morning.    Dispense:  30 capsule    Refill:  3    Cc: Lorenda Hatchet, FNP,  Lorenda Hatchet, FNP  Sarina Ill, MD  [facility] [address] [place], [place] [address]  Phone [phone] Fax [phone]  I spent over 55 minutes of face-to-face and non-face-to-face time with patient on the  1. IIH (idiopathic intracranial  hypertension)    diagnosis.  This included previsit chart review, lab review, study review, order entry, electronic health record documentation, patient education on the different diagnostic and therapeutic options, counseling and coordination of care, risks and benefits of management, compliance, or risk factor reduction

## 2023-01-16 LAB — COMPREHENSIVE METABOLIC PANEL
ALT: 22 IU/L (ref 0–32)
AST: 14 IU/L (ref 0–40)
Albumin/Globulin Ratio: 1.5 (ref 1.2–2.2)
Albumin: 4 g/dL (ref 3.9–4.9)
Alkaline Phosphatase: 72 IU/L (ref 44–121)
BUN/Creatinine Ratio: 13 (ref 9–23)
BUN: 12 mg/dL (ref 6–24)
Bilirubin Total: 0.3 mg/dL (ref 0.0–1.2)
CO2: 16 mmol/L — ABNORMAL LOW (ref 20–29)
Calcium: 9.3 mg/dL (ref 8.7–10.2)
Chloride: 109 mmol/L — ABNORMAL HIGH (ref 96–106)
Creatinine, Ser: 0.92 mg/dL (ref 0.57–1.00)
Globulin, Total: 2.7 g/dL (ref 1.5–4.5)
Glucose: 81 mg/dL (ref 70–99)
Potassium: 4.1 mmol/L (ref 3.5–5.2)
Sodium: 138 mmol/L (ref 134–144)
Total Protein: 6.7 g/dL (ref 6.0–8.5)
eGFR: 78 mL/min/{1.73_m2} (ref >59)

## 2023-01-16 LAB — CBC WITH DIFFERENTIAL/PLATELET
Basophils Absolute: 0 10*3/uL (ref 0.0–0.2)
Basos: 1 %
EOS (ABSOLUTE): 0.1 10*3/uL (ref 0.0–0.4)
Eos: 1 %
Hematocrit: 43.5 % (ref 34.0–46.6)
Hemoglobin: 13.6 g/dL (ref 11.1–15.9)
Immature Grans (Abs): 0 10*3/uL (ref 0.0–0.1)
Immature Granulocytes: 0 %
Lymphocytes Absolute: 3.2 10*3/uL — ABNORMAL HIGH (ref 0.7–3.1)
Lymphs: 43 %
MCH: 26.4 pg — ABNORMAL LOW (ref 26.6–33.0)
MCHC: 31.3 g/dL — ABNORMAL LOW (ref 31.5–35.7)
MCV: 85 fL (ref 79–97)
Monocytes Absolute: 0.6 10*3/uL (ref 0.1–0.9)
Monocytes: 9 %
Neutrophils Absolute: 3.4 10*3/uL (ref 1.4–7.0)
Neutrophils: 46 %
Platelets: 313 10*3/uL (ref 150–450)
RBC: 5.15 x10E6/uL (ref 3.77–5.28)
RDW: 13.6 % (ref 11.7–15.4)
WBC: 7.4 10*3/uL (ref 3.4–10.8)

## 2023-01-29 ENCOUNTER — Other Ambulatory Visit: Payer: Self-pay | Admitting: Neurology

## 2023-01-29 MED ORDER — PHENTERMINE HCL 37.5 MG PO CAPS: 37.5000 mg | ORAL_CAPSULE | ORAL | 3 refills | Status: DC

## 2023-02-01 ENCOUNTER — Encounter: Payer: Self-pay | Admitting: Neurology

## 2023-05-22 ENCOUNTER — Ambulatory Visit (INDEPENDENT_AMBULATORY_CARE_PROVIDER_SITE_OTHER): Payer: BC Managed Care – PPO | Admitting: Neurology

## 2023-05-22 ENCOUNTER — Encounter: Payer: Self-pay | Admitting: Neurology

## 2023-05-22 VITALS — BP 115/74 | HR 62 | Ht 65.0 in | Wt 226.0 lb

## 2023-05-22 DIAGNOSIS — G43709 Chronic migraine without aura, not intractable, without status migrainosus: Secondary | ICD-10-CM | POA: Diagnosis not present

## 2023-05-22 DIAGNOSIS — G932 Benign intracranial hypertension: Secondary | ICD-10-CM | POA: Diagnosis not present

## 2023-05-22 MED ORDER — AIMOVIG 140 MG/ML ~~LOC~~ SOAJ: 140.0000 mg | SUBCUTANEOUS | 11 refills | Status: DC

## 2023-05-22 MED ORDER — RIZATRIPTAN BENZOATE 10 MG PO TBDP: 10.0000 mg | ORAL_TABLET | ORAL | 11 refills | Status: AC | PRN

## 2023-05-22 MED ORDER — ACETAZOLAMIDE ER 500 MG PO CP12: 1000.0000 mg | ORAL_CAPSULE | Freq: Two times a day (BID) | ORAL | 4 refills | Status: DC

## 2023-05-22 MED ORDER — ONDANSETRON 4 MG PO TBDP: 4.0000 mg | ORAL_TABLET | Freq: Three times a day (TID) | ORAL | 3 refills | Status: AC | PRN

## 2023-05-22 NOTE — Progress Notes (Signed)
Provider:  Dr Lucia Gaskins Requesting Provider: Arthor Captain, PA-C Primary Care Provider:  Adolph Pollack, FNP  CC:  headache  05/22/2023: Saw the eye doctor, "everything was fine". Last time I saw her she had no headaches. She has been out of it for 4 days because she was taking extra every day. She is also having migrainous headaches. Pulsating/pounding/throbbing, light and sound sensitivity, nausea. No constipation. She is having at least 8 migraines a day and > 15 headache days a month.  She will drop off fmla. Discussed options.  Patient complains of symptoms per HPI as well as the following symptoms: migraines . Pertinent negatives and positives per HPI. All others negative   Meds taken > 3 months for migraines include: Taken acetazolamide, topamax, amitriptyline,  meloxicam, methocarbamol, Compazine, Tylenol, acetazolamide, Benadryl, Advil, ketorolac injections, Goody powders, Robaxin, meloxicam, Benadryl, Reglan, Zofran and Compazine. amitriptyline, metformin, BP medications contraindicated due to hypotension,   01/15/2023: On diamox, she is feeling better.  CSF with an opening pressure of 22 cm water, measured in the left lateral decubitus position. 17 ml of CSF were obtained for laboratory studies. Closing pressure was 15 cm water. Started on the diamox, taking 3x a day. She is having tingling in the fingers and toes and thins taste flat but otherwise doing well. Lost 4 pounds.Her insurance would not pay for wegovy. They couldn't make it to the [facility] center they need to reschedule. They have a follow up with Dr. Dione Booze and planning to get new glasses. By the time she saw Dr. Dione Booze was already on acetazolamide and the optic nerves were normal. "There is high water mark e.g. would suggest recent swelling"). No spontaneous venous pulsations only exam finding suggestive of ICP. And patient is feeling better, headaches are completely gone, if she takes the medicine the headache comes back.  Also had she not been on treatment, 22 might have ben higher. Saw Dr. Dione Booze last 12/13/22 and has a follow up 5-6 weeks (next is this month sometime) then per his notes will follow every 6 months. He could not get [facility] better than 20/60OD and 20/50 OS. Will treat will phentermine until [facility] Dr. Dione Booze in ophtho could not get VA beter than 20/50 and 20/60 so will wait and try again and gave her eye drops. Reviewed HVF/OCT. 11/28/2022 CTV was normal, reviewed images and agree, IMPRESSION: Normal CT venogram. No evidence of dural venous sinus thrombosis. Reginal Lutes not paid for by her insurabce but her hgba1c is abnormal will try to order ozempic or mounjaro.  Patient complains of symptoms per HPI as well as the following symptoms: tingling . Pertinent negatives and positives per HPI. All others negative   HPI:  Krista Crosby is a 47 y.o. female here as requested by Arthor Captain, PA-C for hospital follow up for headache and questioned IDIOPATHIC INTRACRANIAL HYPERTENSION.  She has a past medical history of obstructive sleep apnea, seizure disorder, obesity, abscess of right leg, sepsis.  I reviewed emergency room notes from 09/30/2023 where she presented with a severe headache.  She complained of persistent frequent headaches for the past few months, taking BC powder every day to try to help with the headaches, sometimes multiple times a day, worse with bright lights and loud noises, no fever numbness or weakness to the arms or legs.  A migraine cocktail helped.  We also considered rebound headaches and talk to her about it.  CT head was negative for acute  process.  She was seen back in the emergency room November 28, 2022 with repeat CT head and CTV which were again negative for anything acute.  Patient reported headaches again, blurry vision, global severe headaches, light sensitivity, ongoing 3 months daily, no other focal neurologic deficits or vision changes.  Physical  and neurologic exam are normal.  LP was attempted but unsuccessful.  Ultrasound in ED ocular showed that the right eye showed increased optic nerve sheath diameter 6 mm as did the left at 7 mm.  Given her body habitus and age IIH was a possibility.  Again migraine cocktail with significant improvement.  They discharged her on acetazolamide 250 mg twice daily.   Migraine cocktails help. She has had headaches 4 months in the setting of some mild weight gain, worsening, band around her head, opening pressure. Opening pressure was only 22 however was on diamox and feels significantly improved after LP with fluid removal. Immediate relief. Today she looks tremendously beter. Still having headache but she can open eyes, vision still blurry but so much better. Opening pressure was only 22 however was on diamox and feels significantly improved after LP with fluid removal. Still some blurry vision, still some pressure headache but yesterday 10/10 mayne 3/10, discussed IDIOPATHIC INTRACRANIAL HYPERTENSION, light still bother her not like before. Uses cpap every night. No other focal neurologic deficits, associated symptoms, inciting events or modifiable factors. No other focal neurologic deficits, associated symptoms, inciting events or modifiable factors. No hx of migraines.   Reviewed notes, labs and imaging from outside physicians, which showed:   CT head 11/28/2022: CLINICAL DATA:  New onset headache.   EXAM: CT HEAD WITHOUT CONTRAST   TECHNIQUE: Contiguous axial images were obtained from the base of the skull through the vertex without intravenous contrast.   RADIATION DOSE REDUCTION: This exam was performed according to the departmental dose-optimization program which includes automated exposure control, adjustment of the mA and/or kV according to patient size and/or use of iterative reconstruction technique.   COMPARISON:  09/28/2022   FINDINGS: Brain: There is no evidence for acute hemorrhage,  hydrocephalus, mass lesion, or abnormal extra-axial fluid collection. No definite CT evidence for acute infarction.   Vascular: No hyperdense vessel or unexpected calcification.   Skull: No evidence for fracture. No worrisome lytic or sclerotic lesion.   Sinuses/Orbits: The visualized paranasal sinuses and mastoid air cells are clear. Visualized portions of the globes and intraorbital fat are unremarkable.   Other: None.   IMPRESSION: No acute intracranial abnormality.  CTV: 11/28/2022:  FINDINGS: Superior sagittal sinus is patent and normal. Both transverse sinuses are patent with the right being dominant. Flow is visible in both sigmoid sinuses and internal jugular veins. Deep veins appear normal. No finding to suggest superficial thrombosis.   IMPRESSION: Normal CT venogram. No evidence of dural venous sinus thrombosis.      Social History   Socioeconomic History   Marital status: Single    Spouse name: Not on file   Number of children: Not on file   Years of education: Not on file   Highest education level: Not on file  Occupational History   Not on file  Tobacco Use   Smoking status: Former   Smokeless tobacco: Never  Substance and Sexual Activity   Alcohol use: Yes    Comment: socially   Drug use: No   Sexual activity: Not on file  Other Topics Concern   Not on file  Social History Narrative   Caffeine: avg  3 cups per day   Social Determinants of Health   Financial Resource Strain: Not on file  Food Insecurity: Not on file  Transportation Needs: Not on file  Physical Activity: Not on file  Stress: Not on file  Social Connections: Not on file  Intimate Partner Violence: Not on file    Family History  Problem Relation Age of Onset   Migraines Neg Hx    Headache Neg Hx     Past Medical History:  Diagnosis Date   Seizures (HCC)     Patient Active Problem List   Diagnosis Date Noted   Chronic migraine without aura without status  migrainosus, not intractable 05/22/2023   IIH (idiopathic intracranial hypertension) 12/04/2022   Sepsis (HCC) 07/21/2020   Seizure disorder (HCC) 07/21/2020   OSA (obstructive sleep apnea) 07/21/2020   Obesity (BMI 30-39.9) 07/21/2020   Abscess of right leg 07/21/2020    Past Surgical History:  Procedure Laterality Date   FOOT SURGERY     LUMBAR PUNCTURE  12/03/2022    Current Outpatient Medications  Medication Sig Dispense Refill   acetaZOLAMIDE ER (DIAMOX) 500 MG capsule Take 2 capsules (1,000 mg total) by mouth 2 (two) times daily. 360 capsule 4   diclofenac Sodium (VOLTAREN) 1 % GEL Apply 4 g topically 4 (four) times daily. 50 g 0   Erenumab-aooe (AIMOVIG) 140 MG/ML SOAJ Inject 140 mg into the skin every 30 (thirty) days. Please use copay card: GROUP: [id number] MEMBER: [id number] BIN: [id number] PCN: [id number] 1.12 mL 11   meloxicam (MOBIC) 15 MG tablet Take 1 tablet (15 mg total) by mouth daily. 10 tablet 0   methocarbamol (ROBAXIN) 500 MG tablet Take 1 tablet (500 mg total) by mouth 2 (two) times daily. 20 tablet 0   mupirocin nasal ointment (BACTROBAN) 2 % Apply in each nostril daily 1 g 0   omeprazole (PRILOSEC OTC) 20 MG tablet Take 1 tablet (20 mg total) by mouth daily. 10 tablet    ondansetron (ZOFRAN-ODT) 4 MG disintegrating tablet Take 1-2 tablets (4-8 mg total) by mouth every 8 (eight) hours as needed. 30 tablet 3   phentermine 37.5 MG capsule Take 1 capsule (37.5 mg total) by mouth every morning. 30 capsule 3   rizatriptan (MAXALT-MLT) 10 MG disintegrating tablet Take 1 tablet (10 mg total) by mouth as needed for migraine. May repeat in 2 hours if needed 9 tablet 11   No current facility-administered medications for this visit.    Allergies as of 05/22/2023   (No Known Allergies)    Vitals: BP 115/74 (BP Location: Right Arm, Patient Position: Sitting, Cuff Size: Large)   Pulse 62   Ht 5\' 5"  (1.651 m)   Wt 226 lb (102.5 kg)   BMI 37.61 kg/m  Last Weight:  Wt  Readings from Last 1 Encounters:  05/22/23 226 lb (102.5 kg)   Last Height:   Ht Readings from Last 1 Encounters:  05/22/23 5\' 5"  (1.651 m)   Physical exam: Exam: Gen: NAD, conversant, well nourised, obese, well groomed                     CV: RRR, no MRG. No Carotid Bruits. No peripheral edema, warm, nontender Eyes: Conjunctivae clear without exudates or hemorrhage  Neuro: Detailed Neurologic Exam  Speech:    Speech is normal; fluent and spontaneous with normal comprehension.  Cognition:    The patient is oriented to person, place, and time;     recent and remote memory  intact;     language fluent;     normal attention, concentration,     fund of knowledge Cranial Nerves:    The pupils are equal, round, and reactive to light. The fundi are normal and spontaneous venous pulsations are present. Visual fields are full to finger confrontation. Extraocular movements are intact. Trigeminal sensation is intact and the muscles of mastication are normal. The face is symmetric. The palate elevates in the midline. Hearing intact. Voice is normal. Shoulder shrug is normal. The tongue has normal motion without fasciculations.   Coordination:    Normal finger to nose and heel to shin. Normal rapid alternating movements.   Gait:    Heel-toe and tandem gait are normal.   Motor Observation:    No asymmetry, no atrophy, and no involuntary movements noted. Tone:    Normal muscle tone.    Posture:    Posture is normal. normal erect    Strength:    Strength is V/V in the upper and lower limbs.      Sensation: intact to LT     Reflex Exam:  DTR's:    Deep tendon reflexes in the upper and lower extremities are normal bilaterally.   Toes:    The toes are downgoing bilaterally.   Clonus:    Clonus is absent.    Assessment/Plan:  47 y.o. female here as requested by Arthor Captain, PA-C for hospital follow up for headache and questioned IDIOPATHIC INTRACRANIAL HYPERTENSION.  She has a  past medical history of obstructive sleep apnea, seizure disorder, morbid obesity, abscess of right leg, sepsis.  I reviewed emergency room notes from 09/30/2023 where she presented with a severe headache.  She complained of persistent frequent headaches for the past few months, taking BC powder every day to try to help with the headaches, sometimes multiple times a day, worse with bright lights and loud noises, no fever numbness or weakness to the arms or legs.  A migraine cocktail helped.  They also considered rebound headaches and talk to her about it.  CT head was negative for acute process.  She was seen back in the emergency room November 28, 2022 with repeat CT head and CTV which were again negative for anything acute.  Patient reported headaches again, blurry vision, global severe headaches, light sensitivity, ongoing 3 months daily, no other focal neurologic deficits, reported vision changes.  Physical and neurologic exam were normal.  LP was attempted but unsuccessful so they just started her on diamox.  Ultrasound in ED ocular showed that the right eye showed increased optic nerve sheath diameter 6 mm as did the left at 7 mm.  Given her body habitus and age IIH was a possibility.  Again migraine cocktail with significant improvement.  They discharged her on acetazolamide 250 mg twice daily.  significantly improved after LP with fluid removal  05/22/2023: Increase acetazolamide for IIH Start Aimovig once monthly or may need to change to emgality or ajovy for migraine prevention Emergency: Try rizatriptan and if it doesn;t work we can try Bernita Raisin or nurtec Nausea: ondansetron FMLA - 3 to 4 days  Meds ordered this encounter  Medications   acetaZOLAMIDE ER (DIAMOX) 500 MG capsule    Sig: Take 2 capsules (1,000 mg total) by mouth 2 (two) times daily.    Dispense:  360 capsule    Refill:  4   Erenumab-aooe (AIMOVIG) 140 MG/ML SOAJ    Sig: Inject 140 mg into the skin every 30 (thirty) days. Please  use copay card: GROUP: [id number] MEMBER: [id number] BIN: [id number] PCN: [id number]    Dispense:  1.12 mL    Refill:  11    Please use copay card: GROUP: [id number] MEMBER: [id number] BIN: [id number] PCN: [id number]   rizatriptan (MAXALT-MLT) 10 MG disintegrating tablet    Sig: Take 1 tablet (10 mg total) by mouth as needed for migraine. May repeat in 2 hours if needed    Dispense:  9 tablet    Refill:  11   ondansetron (ZOFRAN-ODT) 4 MG disintegrating tablet    Sig: Take 1-2 tablets (4-8 mg total) by mouth every 8 (eight) hours as needed.    Dispense:  30 tablet    Refill:  3     - ED discharged her on 250mg  diamox without an opening pressure. We sent her asap to [facility], only 22 but was already on diamox. Also had she not been on treatment, 22 might have ben higher. significantly improved after LP with fluid removal, - On diamox, she is feeling better.   - By the time she saw Dr. Dione Booze 12/13/2022 ophthalmology was already on acetazolamide and the optic nerves were normal BUT Per Dr. Fabian Sharp "There is high water mark e.g. would suggest recent swelling.  No spontaneous venous pulsations only exam finding suggestive of ICP." Dr. Dione Booze in ophtho could not get VA beter than 20/50 and 20/60 so will wait and try again and gave her eye drops. Reviewed HVF/OCT. 11/28/2022  - patient is feeling better, headaches are completely gone, if she takes the medicine the headache comes back.  - Will treat will phentermine until [facility]; Phentermine 15mg  then call to increase to 30 then 37.5 but just a warning this is not a permanent fix and tis medication wears off and if you don't get into a weight loss program you gain the weight back afterwards. - CTV was normal, reviewed images and agree, Normal CT venogram. No evidence of dural venous sinus thrombosis. Reginal Lutes not paid for by her insurabce but her hgba1c is abnormal will try to order ozempic or mounjaro and get her back to  the weight loss center - discussed daily goody powder and rebound headache, stop - SHE DID NOT GET MRI BRAIN/ORBITS - GAVE HER NUMBER TO CALL AND SCHEDULE: order MRI Brain w/wo contrast: MRI brain due to concerning symptoms of morning headaches, positional headaches,vision changes, papilledema, diplopia  to look for space occupying mass, compressive mass, chiari or intracranial hypertension (pseudotumor) or other intracranial etiology.- MRI Orbits: due to papilledema,eye pain on movement, diplopia, vision changes, to look for compressive cavernous lesions, orbital pseudotumor -- Lumbar puncture for opening pressure and labs: OP 22 but was already on diamox. Ultrasound in ED ocular showed that the right eye showed increased optic nerve sheath diameter 6 mm as did the left at 7 mm.  significantly improved after LP with fluid removal - Weight loss is critical, discussed weight loss and risk of permanent vision loss - Remove and intrauterine IUD if you have one - For any acute change especially worsening headache or vision loss call 911 and proceed to ED - continue diamox increase 500mg  TID - weight loss critical, insurance does not pay for wegovy, will try ozempic has abnormal hgba1c  Discussed above and additional Patient instructions:  - [facility] - Call [phone] and get the MRI brain and MRI orbits - Phentermine 15mg  then call to increase to 30 then 37.5 but just  a warning this is not a permanent fix and tis medication wears off and if you don't get into a weight loss program you gain the weight back afterwards. - lose weight - try to get you ozempic or mounjaro since hgba1c is abnormal  To prevent or relieve headaches, try the following: Cool Compress. Lie down and place a cool compress on your head.  Avoid headache triggers. If certain foods or odors seem to have triggered your migraines in the past, avoid them. A headache diary might help you identify triggers.  Include  physical activity in your daily routine. Try a daily walk or other moderate aerobic exercise.  Manage stress. Find healthy ways to cope with the stressors, such as delegating tasks on your to-do list.  Practice relaxation techniques. Try deep breathing, yoga, massage and visualization.  Eat regularly. Eating regularly scheduled meals and maintaining a healthy diet might help prevent headaches. Also, drink plenty of fluids.  Follow a regular sleep schedule. Sleep deprivation might contribute to headaches Consider biofeedback. With this mind-body technique, you learn to control certain bodily functions -- such as muscle tension, heart rate and blood pressure -- to prevent headaches or reduce headache pain.    Proceed to emergency room if you experience new or worsening symptoms or symptoms do not resolve, if you have new neurologic symptoms or if headache is severe, or for any concerning symptom.   Provided education and documentation from American headache [employer] toolbox including articles on: pseudotumoer cerebri(IIH), chronic migraine medication overuse headache, chronic migraines, prevention of migraines and other headaches, behavioral and other nonpharmacologic treatments for headache.   No orders of the defined types were placed in this encounter.  Meds ordered this encounter  Medications   acetaZOLAMIDE ER (DIAMOX) 500 MG capsule    Sig: Take 2 capsules (1,000 mg total) by mouth 2 (two) times daily.    Dispense:  360 capsule    Refill:  4   Erenumab-aooe (AIMOVIG) 140 MG/ML SOAJ    Sig: Inject 140 mg into the skin every 30 (thirty) days. Please use copay card: GROUP: [id number] MEMBER: [id number] BIN: [id number] PCN: [id number]    Dispense:  1.12 mL    Refill:  11    Please use copay card: GROUP: [id number] MEMBER: [id number] BIN: [id number] PCN: [id number]   rizatriptan (MAXALT-MLT) 10 MG disintegrating tablet    Sig: Take 1 tablet (10 mg total) by mouth as needed for migraine. May repeat in 2 hours  if needed    Dispense:  9 tablet    Refill:  11   ondansetron (ZOFRAN-ODT) 4 MG disintegrating tablet    Sig: Take 1-2 tablets (4-8 mg total) by mouth every 8 (eight) hours as needed.    Dispense:  30 tablet    Refill:  3    Cc: Adolph Pollack, FNP,  Adolph Pollack, FNP  Naomie Dean, MD  [facility] [address] [place], [place] [address]  Phone [phone] Fax [phone]  I spent over 30 minutes of face-to-face and non-face-to-face time with patient on the  1. Chronic migraine without aura without status migrainosus, not intractable   2. IIH (idiopathic intracranial hypertension)     diagnosis.  This included previsit chart review, lab review, study review, order entry, electronic health record documentation, patient education on the different diagnostic and therapeutic options, counseling and coordination of care, risks and benefits of management, compliance, or risk factor reduction

## 2023-05-22 NOTE — Patient Instructions (Addendum)
Increase acetazolamide for IIH Start Aimovig once monthly or may need to change to emgality or ajovy for migraine prevention Emergency: Try rizatriptan and if it doesn;t work we can try Bernita Raisin or nurtec Nausea: ondansetron  Ondansetron Dissolving Tablets What is this medication? ONDANSETRON (on DAN se tron) prevents nausea and vomiting from chemotherapy, radiation, or surgery. It works by blocking substances in the body that may cause nausea or vomiting. It belongs to a group of medications called antiemetics. This medicine may be used for other purposes; ask your health care provider or pharmacist if you have questions. COMMON BRAND NAME(S): Zofran ODT What should I tell my care team before I take this medication? They need to know if you have any of these conditions: Heart disease Irregular heartbeat or rhythm Liver disease Low levels of magnesium or potassium in the blood An unusual or allergic reaction to ondansetron, other medications, foods, dyes, or preservatives Pregnant or trying to get pregnant Breastfeeding How should I use this medication? Take this medication by mouth. Take it as directed on the prescription label at the same time every day. You do not need water to take this medication. Leave the tablet in the sealed pack until you are ready to take it. With dry hands, open the pack and gently remove the tablet. Place the tablet in the mouth and allow it to dissolve. Then, swallow it. Talk to your care team about the use of this medication in children. Special care may be needed. Overdosage: If you think you have taken too much of this medicine contact a poison control center or emergency room at once. NOTE: This medicine is only for you. Do not share this medicine with others. What if I miss a dose? If you miss a dose, take it as soon as you can. If it is almost time for your next dose, take only that dose. Do not take double or extra doses. What may interact with this  medication? Do not take this medication with any of the following: Apomorphine Certain medications for fungal infections, such as fluconazole, ketoconazole, posaconazole Cisapride Dronedarone Levoketoconazole Pimozide Quinidine Thioridazine This medication may also interact with the following: Certain medications for depression, anxiety, or other mental health conditions Certain medications for migraines, such as sumatriptan Linezolid Methylene blue Opioids Other medications that cause heart rhythm changes, such as dofetilide or ziprasidone St. John's wort Stimulant medications for ADHD, weight loss, or staying awake Tryptophan This list may not describe all possible interactions. Give your health care provider a list of all the medicines, herbs, non-prescription drugs, or dietary supplements you use. Also tell them if you smoke, drink alcohol, or use illegal drugs. Some items may interact with your medicine. What should I watch for while using this medication? Check with your care team as soon as you can if you have any sign of an allergic reaction. What side effects may I notice from receiving this medication? Side effects that you should report to your care team as soon as possible: Allergic reactions--skin rash, itching, hives, swelling of the face, lips, tongue, or throat Bowel blockage--stomach cramping, unable to have a bowel movement or pass gas, loss of appetite, vomiting Chest pain (angina)--pain, pressure, or tightness in the chest, neck, back, or arms Heart rhythm changes--fast or irregular heartbeat, dizziness, feeling faint or lightheaded, chest pain, trouble breathing Irritability, confusion, fast or irregular heartbeat, muscle stiffness, twitching muscles, sweating, high fever, seizure, chills, vomiting, diarrhea, which may be signs of serotonin syndrome Side effects that  usually do not require medical attention (report to your care team if they continue or are  bothersome): Constipation Diarrhea General discomfort and fatigue Headache This list may not describe all possible side effects. Call your doctor for medical advice about side effects. You may report side effects to FDA at 1-800-FDA-1088. Where should I keep my medication? Keep out of the reach of children and pets. Store between 2 and 30 degrees C (36 and 86 degrees F). Throw away any unused medication after the expiration date. NOTE: This sheet is a summary. It may not cover all possible information. If you have questions about this medicine, talk to your doctor, pharmacist, or health care provider.  2024 Elsevier/Gold Standard (2022-12-05 00:00:00) Rizatriptan Disintegrating Tablets What is this medication? RIZATRIPTAN (rye za TRIP tan) treats migraines. It works by blocking pain signals and narrowing blood vessels in the brain. It belongs to a group of medications called triptans. It is not used to prevent migraines. This medicine may be used for other purposes; ask your health care provider or pharmacist if you have questions. COMMON BRAND NAME(S): Maxalt-MLT What should I tell my care team before I take this medication? They need to know if you have any of these conditions: Circulation problems in fingers and toes Diabetes Heart disease High blood pressure High cholesterol History of irregular heartbeat History of stroke Stomach or intestine problems Tobacco use An unusual or allergic reaction to rizatriptan, other medications, foods, dyes, or preservatives Pregnant or trying to get pregnant Breast-feeding How should I use this medication? Take this medication by mouth. Take it as directed on the prescription label. You do not need water to take this medication. Leave the tablet in the sealed pack until you are ready to take it. With dry hands, open the pack and gently remove the tablet. If the tablet breaks or crumbles, throw it away. Use a new tablet. Place the tablet on the  tongue and allow it to dissolve. Then, swallow it. Do not cut, crush, or chew this medication. Do not use it more often than directed. Talk to your care team about the use of this medication in children. While it may be prescribed for children as young as 6 years for selected conditions, precautions do apply. Overdosage: If you think you have taken too much of this medicine contact a poison control center or emergency room at once. NOTE: This medicine is only for you. Do not share this medicine with others. What if I miss a dose? This does not apply. This medication is not for regular use. What may interact with this medication? Do not take this medication with any of the following: Ergot alkaloids, such as dihydroergotamine, ergotamine MAOIs, such as Marplan, Nardil, Parnate Other medications for migraine headache, such as almotriptan, eletriptan, frovatriptan, naratriptan, sumatriptan, zolmitriptan This medication may also interact with the following: Certain medications for depression, anxiety, or other mental health conditions Propranolol This list may not describe all possible interactions. Give your health care provider a list of all the medicines, herbs, non-prescription drugs, or dietary supplements you use. Also tell them if you smoke, drink alcohol, or use illegal drugs. Some items may interact with your medicine. What should I watch for while using this medication? Visit your care team for regular checks on your progress. Tell your care team if your symptoms do not start to get better or if they get worse. This medication may affect your coordination, reaction time, or judgment. Do not drive or operate machinery until  you know how this medication affects you. Sit up or stand slowly to reduce the risk of dizzy or fainting spells. If you take migraine medications for 10 or more days a month, your migraines may get worse. Keep a diary of headache days and medication use. Contact your care  team if your migraine attacks occur more frequently. What side effects may I notice from receiving this medication? Side effects that you should report to your care team as soon as possible: Allergic reactions--skin rash, itching, hives, swelling of the face, lips, tongue, or throat Burning, pain, tingling, or color changes in the hands, arms, legs, or feet Heart attack--pain or tightness in the chest, shoulders, arms, or jaw, nausea, shortness of breath, cold or clammy skin, feeling faint or lightheaded Heart rhythm changes--fast or irregular heartbeat, dizziness, feeling faint or lightheaded, chest pain, trouble breathing Increase in blood pressure Irritability, confusion, fast or irregular heartbeat, muscle stiffness, twitching muscles, sweating, high fever, seizure, chills, vomiting, diarrhea, which may be signs of serotonin syndrome Raynaud syndrome--cool, numb, or painful fingers or toes that may change color from pale, to blue, to red Seizures Stroke--sudden numbness or weakness of the face, arm, or leg, trouble speaking, confusion, trouble walking, loss of balance or coordination, dizziness, severe headache, change in vision Sudden or severe stomach pain, bloody diarrhea, fever, nausea, vomiting Vision loss Side effects that usually do not require medical attention (report to your care team if they continue or are bothersome): Dizziness Unusual weakness or fatigue This list may not describe all possible side effects. Call your doctor for medical advice about side effects. You may report side effects to FDA at 1-800-FDA-1088. Where should I keep my medication? Keep out of the reach of children and pets. Store at room temperature between 15 and 30 degrees C (59 and 86 degrees F). Protect from light and moisture. Get rid of any unused medication after the expiration date. To get rid of medications that are no longer needed or have expired: Take the medication to a medication take-back  program. Check with your pharmacy or law enforcement to find a location. If you cannot return the medication, check the label or package insert to see if the medication should be thrown out in the garbage or flushed down the toilet. If you are not sure, ask your care team. If it is safe to put it in the trash, empty the medication out of the container. Mix the medication with cat litter, dirt, coffee grounds, or other unwanted substance. Seal the mixture in a bag or container. Put it in the trash. NOTE: This sheet is a summary. It may not cover all possible information. If you have questions about this medicine, talk to your doctor, pharmacist, or health care provider.  2024 Elsevier/Gold Standard (2022-02-01 00:00:00) Erenumab Injection What is this medication? ERENUMAB (e REN ue mab) prevents migraines. It works by blocking a substance in the body that causes migraines. It is a monoclonal antibody. This medicine may be used for other purposes; ask your health care provider or pharmacist if you have questions. COMMON BRAND NAME(S): Aimovig What should I tell my care team before I take this medication? They need to know if you have any of these conditions: High blood pressure An unusual or allergic reaction to erenumab, latex, other medications, foods, dyes, or preservatives Pregnant or trying to get pregnant Breast-feeding How should I use this medication? This medication is injected under the skin. You will be taught how to prepare and give  it. Take it as directed on the prescription label. Keep taking it unless your care team tells you to stop. It is important that you put your used needles and syringes in a special sharps container. Do not put them in a trash can. If you do not have a sharps container, call your pharmacist or care team to get one. Talk to your care team about the use of this medication in children. Special care may be needed. Overdosage: If you think you have taken too much  of this medicine contact a poison control center or emergency room at once. NOTE: This medicine is only for you. Do not share this medicine with others. What if I miss a dose? If you miss a dose, take it as soon as you can. If it is almost time for your next dose, take only that dose. Do not take double or extra doses. What may interact with this medication? Interactions are not expected. This list may not describe all possible interactions. Give your health care provider a list of all the medicines, herbs, non-prescription drugs, or dietary supplements you use. Also tell them if you smoke, drink alcohol, or use illegal drugs. Some items may interact with your medicine. What should I watch for while using this medication? Tell your care team if your symptoms do not start to get better or if they get worse. What side effects may I notice from receiving this medication? Side effects that you should report to your care team as soon as possible: Allergic reactions or angioedema--skin rash, itching or hives, swelling of the face, eyes, lips, tongue, arms, or legs, trouble swallowing or breathing Constipation, bloating, nausea or vomiting, stomach pain, which may be signs of slow movement through the digestive tract Increase in blood pressure Side effects that usually do not require medical attention (report to your care team if they continue or are bothersome): Constipation Muscle pain or cramps Muscle spasms Pain, redness, or irritation at injection site This list may not describe all possible side effects. Call your doctor for medical advice about side effects. You may report side effects to FDA at 1-800-FDA-1088. Where should I keep my medication? Keep out of the reach of children and pets. Store in a refrigerator or at room temperature between 20 and 25 degrees C (68 and 77 degrees F). Refrigeration (preferred): Store in the refrigerator. Do not freeze. Keep in original container until you are  ready to take it. Remove the dose from the carton about 30 minutes before it is time for you to use it. If the dose is not used, it may be stored in original container at room temperature for 7 days. Throw away any unused medication after the expiration date. Room Temperature: This medication may be stored at room temperature for up to 7 days. Keep in original container. Protect from light until time of use. If it is stored at room temperature, throw away any unused medication after 7 days or after it expires, whichever is first. To get rid of medications that are no longer needed or have expired: Take the medication to a medication take-back program. Check with your pharmacy or law enforcement to find a location. If you cannot return the medication, ask your pharmacist or care team how to get rid of this medication safely. NOTE: This sheet is a summary. It may not cover all possible information. If you have questions about this medicine, talk to your doctor, pharmacist, or health care provider.  2024 Elsevier/Gold Standard (  2021-11-24 00:00:00)   

## 2023-08-30 ENCOUNTER — Other Ambulatory Visit: Payer: Self-pay

## 2023-08-30 ENCOUNTER — Emergency Department
Admission: EM | Admit: 2023-08-30 | Discharge: 2023-08-30 | Disposition: A | Discharge status: Home / Self Care | Payer: BC Managed Care – PPO | Attending: Emergency Medicine | Admitting: Emergency Medicine

## 2023-08-30 DIAGNOSIS — H1089 Other conjunctivitis: Secondary | ICD-10-CM | POA: Diagnosis not present

## 2023-08-30 DIAGNOSIS — H1032 Unspecified acute conjunctivitis, left eye: Secondary | ICD-10-CM

## 2023-08-30 DIAGNOSIS — H5789 Other specified disorders of eye and adnexa: Secondary | ICD-10-CM | POA: Diagnosis present

## 2023-08-30 MED ORDER — GATIFLOXACIN 0.5 % OP SOLN: 1.0000 [drp] | Freq: Four times a day (QID) | OPHTHALMIC | 0 refills | Status: AC

## 2023-08-30 MED ORDER — GATIFLOXACIN 0.5 % OP SOLN
1.0000 [drp] | Freq: Four times a day (QID) | OPHTHALMIC | Status: DC
  Administered 2023-08-30: 1 [drp] via OPHTHALMIC
  Filled 2023-08-30: qty 2.5

## 2023-08-30 NOTE — ED Triage Notes (Signed)
Pt to ED for left eye problem, states was raking yard yesterday. This morning eye is swollen with drainage, feels like something might be in eye.

## 2023-08-30 NOTE — ED Provider Notes (Signed)
[facility] Provider Note  Patient Contact: 10:53 PM (approximate)   History   Eye Drainage   HPI  Krista Crosby is a 47 y.o. female who presents the emergency department for left eye irritation, swelling and drainage.  Patient states that she thinks this may be secondary to doing yard work a few days ago.  Patient does not wear glasses or contacts.  No pain to the eye.  Patient has a large amount of purulent drainage from the eye.     Physical Exam   Triage Vital Signs: ED Triage Vitals  Encounter Vitals Group     BP 08/30/23 1824 121/84     Systolic BP Percentile --      Diastolic BP Percentile --      Pulse Rate 08/30/23 1824 92     Resp 08/30/23 1824 18     Temp 08/30/23 1824 98.2 F (36.8 C)     Temp src --      SpO2 08/30/23 1824 97 %     Weight 08/30/23 1823 230 lb (104.3 kg)     Height 08/30/23 1823 5\' 7"  (1.702 m)     Head Circumference --      Peak Flow --      Pain Score 08/30/23 1822 10     Pain Loc --      Pain Education --      Exclude from Growth Chart --     Most recent vital signs: Vitals:   08/30/23 1824 08/30/23 2039  BP: 121/84 130/80  Pulse: 92 86  Resp: 18 18  Temp: 98.2 F (36.8 C)   SpO2: 97% 98%     General: Alert and in no acute distress. Eyes:  PERRL. EOMI. significant amount of purulent drainage from the left eye.  Left upper eyelid is slightly edematous, no tenderness to palpation around the orbit.   Cardiovascular:  Good peripheral perfusion Respiratory: Normal respiratory effort without tachypnea or retractions. Lungs CTAB.  Musculoskeletal: Full range of motion to all extremities.  Neurologic:  No gross focal neurologic deficits are appreciated.  Skin:   No rash noted Other:   ED Results / Procedures / Treatments   Labs (all labs ordered are listed, but only abnormal results are displayed) Labs Reviewed - No data to display   EKG     RADIOLOGY   No results  found.  PROCEDURES:  Critical Care performed: No  Procedures   MEDICATIONS ORDERED IN ED: Medications  gatifloxacin (ZYMAXID) 0.5 % ophthalmic drops 1 drop (1 drop Left Eye Given 08/30/23 2245)     IMPRESSION / MDM / ASSESSMENT AND PLAN / ED COURSE  I reviewed the triage vital signs and the nursing notes.                                 Differential diagnosis includes, but is not limited to, conjunctivitis (allergic, viral, chemical, bacterial) periorbital cellulitis, hordeolum, blepharitis   Patient's presentation is most consistent with acute presentation with potential threat to life or bodily function.   Patient's diagnosis is consistent with conjunctivitis.  Patient presents to the emergency department with left eye irritation and purulent drainage.  Findings on physical exam are consistent with conjunctivitis.  Patient is placed on gatifloxacin.  First dosing administered in the ED.  Concerning signs and symptoms warranting ophthalmology follow-up were discussed.  Follow-up primary care as needed..  Patient is given  ED precautions to return to the ED for any worsening or new symptoms.     FINAL CLINICAL IMPRESSION(S) / ED DIAGNOSES   Final diagnoses:  Acute bacterial conjunctivitis of left eye     Rx / DC Orders   ED Discharge Orders          Ordered    gatifloxacin (ZYMAXID) 0.5 % SOLN  4 times daily        08/30/23 2256             Note:  This document was prepared using Dragon voice recognition software and may include unintentional dictation errors.   Racheal Patches, PA-C 08/30/23 2257    Minna Antis, MD 09/02/23 2317

## 2023-09-01 ENCOUNTER — Emergency Department: Payer: BC Managed Care – PPO

## 2023-09-01 ENCOUNTER — Other Ambulatory Visit: Payer: Self-pay

## 2023-09-01 ENCOUNTER — Encounter: Payer: Self-pay | Admitting: Emergency Medicine

## 2023-09-01 ENCOUNTER — Emergency Department
Admission: EM | Admit: 2023-09-01 | Discharge: 2023-09-01 | Disposition: A | Discharge status: Home / Self Care | Payer: BC Managed Care – PPO | Attending: Emergency Medicine | Admitting: Emergency Medicine

## 2023-09-01 DIAGNOSIS — L03213 Periorbital cellulitis: Secondary | ICD-10-CM | POA: Insufficient documentation

## 2023-09-01 DIAGNOSIS — H1032 Unspecified acute conjunctivitis, left eye: Secondary | ICD-10-CM | POA: Insufficient documentation

## 2023-09-01 DIAGNOSIS — H5712 Ocular pain, left eye: Secondary | ICD-10-CM | POA: Diagnosis present

## 2023-09-01 LAB — BASIC METABOLIC PANEL
Anion gap: 4 — ABNORMAL LOW (ref 5–15)
BUN: 8 mg/dL (ref 6–20)
CO2: 21 mmol/L — ABNORMAL LOW (ref 22–32)
Calcium: 8.5 mg/dL — ABNORMAL LOW (ref 8.9–10.3)
Chloride: 110 mmol/L (ref 98–111)
Creatinine, Ser: 0.62 mg/dL (ref 0.44–1.00)
GFR, Estimated: >60 mL/min (ref >60)
Glucose, Bld: 91 mg/dL (ref 70–99)
Potassium: 3.6 mmol/L (ref 3.5–5.1)
Sodium: 135 mmol/L (ref 135–145)

## 2023-09-01 LAB — CBC WITH DIFFERENTIAL/PLATELET
Abs Immature Granulocytes: 0.02 10*3/uL (ref 0.00–0.07)
Basophils Absolute: 0 10*3/uL (ref 0.0–0.1)
Basophils Relative: 0 %
Eosinophils Absolute: 0.1 10*3/uL (ref 0.0–0.5)
Eosinophils Relative: 1 %
HCT: 43.1 % (ref 36.0–46.0)
Hemoglobin: 13.5 g/dL (ref 12.0–15.0)
Immature Granulocytes: 0 %
Lymphocytes Relative: 31 %
Lymphs Abs: 2.3 10*3/uL (ref 0.7–4.0)
MCH: 26.9 pg (ref 26.0–34.0)
MCHC: 31.3 g/dL (ref 30.0–36.0)
MCV: 85.9 fL (ref 80.0–100.0)
Monocytes Absolute: 0.8 10*3/uL (ref 0.1–1.0)
Monocytes Relative: 11 %
Neutro Abs: 4.3 10*3/uL (ref 1.7–7.7)
Neutrophils Relative %: 57 %
Platelets: 289 10*3/uL (ref 150–400)
RBC: 5.02 MIL/uL (ref 3.87–5.11)
RDW: 13.9 % (ref 11.5–15.5)
WBC: 7.5 10*3/uL (ref 4.0–10.5)
nRBC: 0 % (ref 0.0–0.2)

## 2023-09-01 MED ORDER — SODIUM CHLORIDE 0.9 % IV SOLN
2.0000 g | INTRAVENOUS | Status: DC
  Administered 2023-09-01: 2 g via INTRAVENOUS
  Filled 2023-09-01: qty 20

## 2023-09-01 MED ORDER — FLUORESCEIN SODIUM 1 MG OP STRP
1.0000 | ORAL_STRIP | Freq: Once | OPHTHALMIC | Status: AC
  Administered 2023-09-01: 1 via OPHTHALMIC
  Filled 2023-09-01: qty 1

## 2023-09-01 MED ORDER — AMOXICILLIN-POT CLAVULANATE 875-125 MG PO TABS: 1.0000 | ORAL_TABLET | Freq: Two times a day (BID) | ORAL | 0 refills | Status: AC

## 2023-09-01 MED ORDER — TETRACAINE HCL 0.5 % OP SOLN
2.0000 [drp] | Freq: Once | OPHTHALMIC | Status: AC
  Administered 2023-09-01: 2 [drp] via OPHTHALMIC
  Filled 2023-09-01: qty 4

## 2023-09-01 MED ORDER — ERYTHROMYCIN 5 MG/GM OP OINT: 1.0000 | TOPICAL_OINTMENT | Freq: Every day | OPHTHALMIC | 0 refills | Status: AC

## 2023-09-01 MED ORDER — IOHEXOL 300 MG/ML  SOLN
75.0000 mL | Freq: Once | INTRAMUSCULAR | Status: AC | PRN
  Administered 2023-09-01: 75 mL via INTRAVENOUS

## 2023-09-01 NOTE — Discharge Instructions (Signed)
Please take the antibiotics as prescribed.  You may continue the eyedrops that you already have, and then apply the ointment at night.  Take the oral antibiotics twice daily.  Please follow-up with the eye doctor, Dr. Inez Pilgrim, tomorrow.  You may call his office just before 8 AM.  Please return for any new, worsening, or change in symptoms or other concerns. It was a pleasure caring for you today.

## 2023-09-01 NOTE — ED Notes (Signed)
See triage note Presents with swelling to left eye  States she was seen on Friday for same States she was racking the yard  States swelling is worse today  Now having headache No fever

## 2023-09-01 NOTE — ED Provider Notes (Signed)
[facility] Provider Note    Event Date/Time   First MD Initiated Contact with Patient 09/01/23 [phone]     (approximate)   History   Eye Problem   HPI  Krista Crosby is a 47 y.o. female who presents today for evaluation of left eye pain and swelling.  Patient reports that she was doing yard work on Thursday 11/14 and did not feel anything get into her eye, and does not think that she did any rubbing of her eye, though Friday morning she awoke with discharge and swelling to her upper eyelid.  She came to the emergency department on Friday evening and was prescribed eyedrops for presumed conjunctivitis.  Patient reports that she has been using the eyedrops as prescribed but the swelling and the pain has worsened.  She reports that she is unable to open her eye now.  She does not wear contact lenses but she does wear glasses.  She reports that when she opens her eyes with her hand she feels that her vision is normal.  Patient Active Problem List   Diagnosis Date Noted   Chronic migraine without aura without status migrainosus, not intractable 05/22/2023   IIH (idiopathic intracranial hypertension) 12/04/2022   Sepsis (HCC) 07/21/2020   Seizure disorder (HCC) 07/21/2020   OSA (obstructive sleep apnea) 07/21/2020   Obesity (BMI 30-39.9) 07/21/2020   Abscess of right leg 07/21/2020          Physical Exam   Triage Vital Signs: ED Triage Vitals  Encounter Vitals Group     BP 09/01/23 0651 133/86     Systolic BP Percentile --      Diastolic BP Percentile --      Pulse Rate 09/01/23 0651 72     Resp 09/01/23 0651 16     Temp 09/01/23 0651 98.6 F (37 C)     Temp Source 09/01/23 0651 Oral     SpO2 09/01/23 0651 98 %     Weight 09/01/23 0700 230 lb (104.3 kg)     Height 09/01/23 0700 5\' 7"  (1.702 m)     Head Circumference --      Peak Flow --      Pain Score 09/01/23 0700 10     Pain Loc --      Pain Education --      Exclude from Growth Chart --      Most recent vital signs: Vitals:   09/01/23 0651  BP: 133/86  Pulse: 72  Resp: 16  Temp: 98.6 F (37 C)  SpO2: 98%    Physical Exam Vitals and nursing note reviewed.  Constitutional:      General: Awake and alert. No acute distress.    Appearance: Normal appearance. The patient is obese.  HENT:     Head: Normocephalic and atraumatic.     Mouth: Mucous membranes are moist.  Eyes:     General: PERRL. Normal EOMs        Right eye: No discharge.        Left eye: Significant upper eye lid swelling.  When eye is pried open there is chemosis and white thick discharge.  Pupil is round and reactive.  Patient has full extraocular movements but reports pain with extraocular movements.  Ocular pressure 18.  No fluorescein uptake or visible foreign body noted.  Negative Seidel sign.  No resistance with retropulsion.  No proptosis. Cardiovascular:     Rate and Rhythm: Normal rate and regular rhythm.  Pulses: Normal pulses.  Pulmonary:     Effort: Pulmonary effort is normal. No respiratory distress.     Breath sounds: Normal breath sounds.  Abdominal:     Abdomen is soft. There is no abdominal tenderness. No rebound or guarding. No distention. Musculoskeletal:        General: No swelling. Normal range of motion.     Cervical back: Normal range of motion and neck supple.  Skin:    General: Skin is warm and dry.     Capillary Refill: Capillary refill takes less than 2 seconds.     Findings: No rash.  Neurological:     Mental Status: The patient is awake and alert.      ED Results / Procedures / Treatments   Labs (all labs ordered are listed, but only abnormal results are displayed) Labs Reviewed  BASIC METABOLIC PANEL - Abnormal; Notable for the following components:      Result Value   CO2 21 (*)    Calcium 8.5 (*)    Anion gap 4 (*)    All other components within normal limits  CBC WITH DIFFERENTIAL/PLATELET     EKG     RADIOLOGY I independently reviewed and  interpreted imaging and agree with radiologists findings.     PROCEDURES:  Critical Care performed:   Procedures   MEDICATIONS ORDERED IN ED: Medications  cefTRIAXone (ROCEPHIN) 2 g in sodium chloride 0.9 % 100 mL IVPB (0 g Intravenous Stopped 09/01/23 1025)  fluorescein ophthalmic strip 1 strip (1 strip Left Eye Given by Other 09/01/23 6962)  tetracaine (PONTOCAINE) 0.5 % ophthalmic solution 2 drop (2 drops Left Eye Given by Other 09/01/23 0813)  iohexol (OMNIPAQUE) 300 MG/ML solution 75 mL (75 mLs Intravenous Contrast Given 09/01/23 0838)     IMPRESSION / MDM / ASSESSMENT AND PLAN / ED COURSE  I reviewed the triage vital signs and the nursing notes.   Differential diagnosis includes, but is not limited to, preseptal cellulitis, orbital cellulitis, retained foreign body.  Patient is awake and alert, hemodynamically stable and afebrile.  She has significant swelling to her left upper eyelid and is unable to open her eye without manually opening it.  She has a negative Seidel sign, no fluorescein uptake, no obvious foreign body noted, though exam is difficult given the degree of swelling.  I reviewed the patient's chart.  Patient was seen on 08/30/2023 and diagnosed with conjunctivitis and started on gatifloxacin drops.  IV was established and labs were obtained which are overall reassuring, no leukocytosis.  CT orbits reveals findings consistent with preseptal cellulitis/conjunctivitis on the left without orbital extension or fluid collection.  This was discussed with Dr. Inez Pilgrim given her pain with extraocular movements who feels that this is appropriate for outpatient management.  He recommends a dose of Rocephin in the ER, and then discharged on Augmentin.  He will see her tomorrow.  Patient understands to call the clinic first thing in the morning.  We discussed return precautions in the meantime.  Patient understands and agrees with plan.   Patient's presentation is most  consistent with acute presentation with potential threat to life or bodily function.   Clinical Course as of 09/01/23 1207  Wynelle Link Sep 01, 2023  [id number] Spoke with Dr. Inez Pilgrim with ophthalmology who recommends dose of IV antibiotics now, oral antibiotics upon discharge, and follow-up tomorrow in clinic [JP]    Clinical Course User Index [JP] Deon Duer, Herb Grays, PA-C     FINAL CLINICAL IMPRESSION(S) /  ED DIAGNOSES   Final diagnoses:  Preseptal cellulitis of left eye  Acute bacterial conjunctivitis of left eye     Rx / DC Orders   ED Discharge Orders          Ordered    amoxicillin-clavulanate (AUGMENTIN) 875-125 MG tablet  2 times daily        09/01/23 0936    erythromycin ophthalmic ointment  Daily at bedtime        09/01/23 0936             Note:  This document was prepared using Dragon voice recognition software and may include unintentional dictation errors.   Keturah Shavers 09/01/23 1207    Jene Every, MD 09/01/23 [phone]

## 2023-09-01 NOTE — ED Triage Notes (Signed)
Pt in via POV, reports being seen here Friday, dx w/ Conjunctivitis and prescribed eye drops.  No relief with eye drops; patient presents today w/ left eye swollen shut, reports white discharge from  eye as well.  Reports taking eye drops as prescribed.  Ambulatory to triage, NAD noted at this time.

## 2023-09-01 NOTE — ED Notes (Signed)
Patient declined discharge vital signs. 

## 2023-11-25 ENCOUNTER — Encounter: Payer: Self-pay | Admitting: Neurology

## 2023-11-25 ENCOUNTER — Ambulatory Visit (INDEPENDENT_AMBULATORY_CARE_PROVIDER_SITE_OTHER): Payer: BC Managed Care – PPO | Admitting: Neurology

## 2023-11-25 VITALS — BP 121/84 | HR 76 | Ht 65.0 in | Wt 229.0 lb

## 2023-11-25 DIAGNOSIS — G932 Benign intracranial hypertension: Secondary | ICD-10-CM

## 2023-11-25 MED ORDER — ACETAZOLAMIDE ER 500 MG PO CP12: 500.0000 mg | ORAL_CAPSULE | Freq: Three times a day (TID) | ORAL | 4 refills | Status: DC

## 2023-11-25 NOTE — Progress Notes (Signed)
 Provider:  Dr Tresia Fruit Requesting Provider: Tama Fails, PA-C Primary Care Provider:  Diona Franklin, FNP  CC:  headache  11/25/2023: LOVELY patient who usually comes with her wife. We see her for IDIOPATHIC INTRACRANIAL HYPERTENSION and she is doing well/ stale.  Was recently seen in the ED and dxed with orbital cellulitis right eye given antibiotics (IV in ED and oral upon discharge) and followed up outpatient 09/01/2023. On 05/2023 diamox  was increased 1000mg  bid and she is currently doing well on 500mg  tid instead so we can keep it that way. We referred her to the [facility] in 01/2023, does not appear as though she was able to go. No blurry vision. She will get a migraine headache 1-2x a month, she was supposed to send us  FMLA we spoke about it 05/22/2023 and they will get it to use we can backdate to 12/2022.She is taking acetazolamide  3x a day. We put her on aimovig  05/2023 for her migraines hasn't needed to take it. Feeling well, she isn't sure how may migraine days a month she has had. As long as she takes her medication she is fine.  Get us  FMLA and we back date it to 12/2022.  Patient complains of symptoms per HPI as well as the following symptoms: none . Pertinent negatives and positives per HPI. All others negative   05/22/2023: Saw the eye doctor, "everything was fine". Last time I saw her she had no headaches. She has been out of it for 4 days because she was taking extra every day. She is also having migrainous headaches. Pulsating/pounding/throbbing, light and sound sensitivity, nausea. No constipation. She is having at least 8 migraines a day and > 15 headache days a month.  She will drop off fmla. Discussed options.  Patient complains of symptoms per HPI as well as the following symptoms: migraines . Pertinent negatives and positives per HPI. All others negative   Meds taken > 3 months for migraines include: Taken acetazolamide , topamax, amitriptyline,   meloxicam , methocarbamol , Compazine , Tylenol , acetazolamide , Benadryl , Advil , ketorolac  injections, Goody powders, Robaxin , meloxicam , Benadryl , Reglan , Zofran  and Compazine . amitriptyline, metformin, BP medications contraindicated due to hypotension,   01/15/2023: On diamox , she is feeling better.  CSF with an opening pressure of 22 cm water, measured in the left lateral decubitus position. 17 ml of CSF were obtained for laboratory studies. Closing pressure was 15 cm water. Started on the diamox , taking 3x a day. She is having tingling in the fingers and toes and thins taste flat but otherwise doing well. Lost 4 pounds.Her insurance would not pay for wegovy . They couldn't make it to the headache wellness center they need to reschedule. They have a follow up with Dr. Candi Chafe and planning to get new glasses. By the time she saw Dr. Candi Chafe was already on acetazolamide  and the optic nerves were normal. "There is high water mark e.g. would suggest recent swelling"). No spontaneous venous pulsations only exam finding suggestive of ICP. And patient is feeling better, headaches are completely gone, if she takes the medicine the headache comes back. Also had she not been on treatment, 22 might have ben higher. Saw Dr. Candi Chafe last 12/13/22 and has a follow up 5-6 weeks (next is this month sometime) then per his notes will follow every 6 months. He could not get [facility] better than 20/60OD and 20/50 OS. Will treat will phentermine  until she can see [facility]. Dr. Candi Chafe in ophtho could not get VA beter  than 20/50 and 20/60 so will wait and try again and gave her eye drops. Reviewed HVF/OCT. 11/28/2022 CTV was normal, reviewed images and agree, IMPRESSION: Normal CT venogram. No evidence of dural venous sinus thrombosis. Wegovy  not paid for by her insurabce but her hgba1c is abnormal will try to order ozempic or mounjaro.  Patient complains of symptoms per HPI as well as the following symptoms: tingling .  Pertinent negatives and positives per HPI. All others negative   HPI:  Krista Crosby is a 48 y.o. female here as requested by Tama Fails, PA-C for hospital follow up for headache and questioned IDIOPATHIC INTRACRANIAL HYPERTENSION.  She has a past medical history of obstructive sleep apnea, seizure disorder, obesity, abscess of right leg, sepsis.  I reviewed emergency room notes from 09/30/2023 where she presented with a severe headache.  She complained of persistent frequent headaches for the past few months, taking BC powder every day to try to help with the headaches, sometimes multiple times a day, worse with bright lights and loud noises, no fever numbness or weakness to the arms or legs.  A migraine cocktail helped.  We also considered rebound headaches and talk to her about it.  CT head was negative for acute process.  She was seen back in the emergency room November 28, 2022 with repeat CT head and CTV which were again negative for anything acute.  Patient reported headaches again, blurry vision, global severe headaches, light sensitivity, ongoing 3 months daily, no other focal neurologic deficits or vision changes.  Physical and neurologic exam are normal.  LP was attempted but unsuccessful.  Ultrasound in ED ocular showed that the right eye showed increased optic nerve sheath diameter 6 mm as did the left at 7 mm.  Given her body habitus and age IIH was a possibility.  Again migraine cocktail with significant improvement.  They discharged her on acetazolamide  250 mg twice daily.   Migraine cocktails help. She has had headaches 4 months in the setting of some mild weight gain, worsening, band around her head, opening pressure. Opening pressure was only 22 however was on diamox  and feels significantly improved after LP with fluid removal. Immediate relief. Today she looks tremendously beter. Still having headache but she can open eyes, vision still blurry but so much better. Opening pressure was  only 22 however was on diamox  and feels significantly improved after LP with fluid removal. Still some blurry vision, still some pressure headache but yesterday 10/10 mayne 3/10, discussed IDIOPATHIC INTRACRANIAL HYPERTENSION, light still bother her not like before. Uses cpap every night. No other focal neurologic deficits, associated symptoms, inciting events or modifiable factors. No other focal neurologic deficits, associated symptoms, inciting events or modifiable factors. No hx of migraines.   Reviewed notes, labs and imaging from outside physicians, which showed:   CT head 11/28/2022: CLINICAL DATA:  New onset headache.   EXAM: CT HEAD WITHOUT CONTRAST   TECHNIQUE: Contiguous axial images were obtained from the base of the skull through the vertex without intravenous contrast.   RADIATION DOSE REDUCTION: This exam was performed according to the departmental dose-optimization program which includes automated exposure control, adjustment of the mA and/or kV according to patient size and/or use of iterative reconstruction technique.   COMPARISON:  09/28/2022   FINDINGS: Brain: There is no evidence for acute hemorrhage, hydrocephalus, mass lesion, or abnormal extra-axial fluid collection. No definite CT evidence for acute infarction.   Vascular: No hyperdense vessel or unexpected calcification.   Skull:  No evidence for fracture. No worrisome lytic or sclerotic lesion.   Sinuses/Orbits: The visualized paranasal sinuses and mastoid air cells are clear. Visualized portions of the globes and intraorbital fat are unremarkable.   Other: None.   IMPRESSION: No acute intracranial abnormality.  CTV: 11/28/2022:  FINDINGS: Superior sagittal sinus is patent and normal. Both transverse sinuses are patent with the right being dominant. Flow is visible in both sigmoid sinuses and internal jugular veins. Deep veins appear normal. No finding to suggest superficial thrombosis.    IMPRESSION: Normal CT venogram. No evidence of dural venous sinus thrombosis.      Social History   Socioeconomic History   Marital status: Married    Spouse name: Not on file   Number of children: Not on file   Years of education: Not on file   Highest education level: Not on file  Occupational History   Not on file  Tobacco Use   Smoking status: Former   Smokeless tobacco: Never  Vaping Use   Vaping status: Never Used  Substance and Sexual Activity   Alcohol use: Yes    Comment: socially   Drug use: No   Sexual activity: Yes    Birth control/protection: None  Other Topics Concern   Not on file  Social History Narrative   Caffeine: avg 3 cups per day   Social Drivers of Health   Financial Resource Strain: Not on file  Food Insecurity: Not on file  Transportation Needs: Not on file  Physical Activity: Not on file  Stress: Not on file  Social Connections: Not on file  Intimate Partner Violence: Not on file    Family History  Problem Relation Age of Onset   Migraines Neg Hx    Headache Neg Hx     Past Medical History:  Diagnosis Date   Seizures (HCC)     Patient Active Problem List   Diagnosis Date Noted   Chronic migraine without aura without status migrainosus, not intractable 05/22/2023   IIH (idiopathic intracranial hypertension) 12/04/2022   Sepsis (HCC) 07/21/2020   Seizure disorder (HCC) 07/21/2020   OSA (obstructive sleep apnea) 07/21/2020   Obesity (BMI 30-39.9) 07/21/2020   Abscess of right leg 07/21/2020    Past Surgical History:  Procedure Laterality Date   FOOT SURGERY     LUMBAR PUNCTURE  12/03/2022    Current Outpatient Medications  Medication Sig Dispense Refill   diclofenac  Sodium (VOLTAREN ) 1 % GEL Apply 4 g topically 4 (four) times daily. 50 g 0   erythromycin  ophthalmic ointment Place 1 Application into the left eye at bedtime. 3.5 g 0   meloxicam  (MOBIC ) 15 MG tablet Take 1 tablet (15 mg total) by mouth daily. 10 tablet 0    methocarbamol  (ROBAXIN ) 500 MG tablet Take 1 tablet (500 mg total) by mouth 2 (two) times daily. 20 tablet 0   mupirocin  nasal ointment (BACTROBAN ) 2 % Apply in each nostril daily 1 g 0   omeprazole  (PRILOSEC  OTC) 20 MG tablet Take 1 tablet (20 mg total) by mouth daily. 10 tablet    ondansetron  (ZOFRAN -ODT) 4 MG disintegrating tablet Take 1-2 tablets (4-8 mg total) by mouth every 8 (eight) hours as needed. 30 tablet 3   rizatriptan  (MAXALT -MLT) 10 MG disintegrating tablet Take 1 tablet (10 mg total) by mouth as needed for migraine. May repeat in 2 hours if needed 9 tablet 11   acetaZOLAMIDE  ER (DIAMOX ) 500 MG capsule Take 1 capsule (500 mg total) by mouth 3 (three)  times daily. 270 capsule 4   No current facility-administered medications for this visit.    Allergies as of 11/25/2023   (No Known Allergies)    Vitals: BP 121/84 (BP Location: Right Arm, Patient Position: Sitting, Cuff Size: Normal)   Pulse 76   Ht 5\' 5"  (1.651 m)   Wt 229 lb (103.9 kg)   BMI 38.11 kg/m  Last Weight:  Wt Readings from Last 1 Encounters:  11/25/23 229 lb (103.9 kg)   Last Height:   Ht Readings from Last 1 Encounters:  11/25/23 5\' 5"  (1.651 m)  Physical exam: Exam: Gen: NAD, conversant, well nourised, obese, well groomed                     CV: RRR, no MRG. No Carotid Bruits. No peripheral edema, warm, nontender Eyes: Conjunctivae clear without exudates or hemorrhage  Neuro: Detailed Neurologic Exam  Speech:    Speech is normal; fluent and spontaneous with normal comprehension.  Cognition:    The patient is oriented to person, place, and time;     recent and remote memory intact;     language fluent;     normal attention, concentration,     fund of knowledge Cranial Nerves:    The pupils are equal, round, and reactive to light. The fundi are normal and spontaneous venous pulsations are present. Visual fields are full to finger confrontation. Extraocular movements are intact. Trigeminal  sensation is intact and the muscles of mastication are normal. The face is symmetric. The palate elevates in the midline. Hearing intact. Voice is normal. Shoulder shrug is normal. The tongue has normal motion without fasciculations.   Coordination: nml  Gait: nml  Motor Observation:    No asymmetry, no atrophy, and no involuntary movements noted. Tone:    Normal muscle tone.    Posture:    Posture is normal. normal erect    Strength:    Strength is V/V in the upper and lower limbs.      Sensation: intact to LT     Reflex Exam:  DTR's:    Deep tendon reflexes in the upper and lower extremities are normal bilaterally.   Toes:    The toes are downgoing bilaterally.   Clonus:    Clonus is absent.    Assessment/Plan:  48 y.o. female here as requested by Tama Fails, PA-C for IIH  She has a past medical history of obstructive sleep apnea, seizure disorder, morbid obesity, abscess of right leg, sepsis.  I reviewed emergency room notes from 09/28/2022 where she presented with a severe headache.  She complained of persistent frequent headaches for the past few months, taking BC powder every day to try to help with the headaches, sometimes multiple times a day, worse with bright lights and loud noises, no fever numbness or weakness to the arms or legs.  A migraine cocktail helped.  They also considered rebound headaches.  CT head was negative for acute process.  She was seen back in the emergency room November 28, 2022 with repeat CT head and CTV which were again negative for anything acute.  Patient reported headaches again, blurry vision, global severe headaches, light sensitivity, ongoing 3 months daily, no other focal neurologic deficits, reported vision changes.  Physical and neurologic exam were normal.  LP was attempted but unsuccessful so they just started her on diamox .  Ultrasound in ED ocular showed that the right eye showed increased optic nerve sheath diameter 6 mm as did  the  left at 7 mm.  Given her body habitus and age IIH was a possibility.   They discharged her on acetazolamide  250 mg twice daily.  significantly improved after LP with fluid removal. LP OP was only 22 but she was already on acetazolamide .  - doing well on 500mg  diamox  tid, no headaches, few migraines - recommended healthy [facility] wellness 01/2023, she has not been able to go, recommended weight loss - She will get a migraine headache 1-2x a month, she was supposed to send us  FMLA we spoke about it 05/22/2023 and they will get it to us  we can backdate to 12/2022. - We prescribed her on aimovig  05/2023 for her migraines hasn't needed to take it.  - Feeling well, she isn't sure how may migraine days a month she has had. As long as she takes her medication she is fine.  Get us  FMLA and we back date it to 12/2022. - states rizatriptan  helps acutely, next we  we can try Ubrelvy or nurtec Nausea: ondansetron  - By the time she saw Dr. Candi Chafe 12/13/2022 in ophthalmology, she was already on acetazolamide  and the optic nerves were normal BUT Per Dr. Diedre Fox "There is high water mark e.g. would suggest recent swelling.  No spontaneous venous pulsations only exam finding suggestive of ICP."  - 12/03/2022: CTV was normal - she did not get mri brain/orbits completed - Weight loss is critical, discussed weight loss and risk of permanent vision loss - Remove and intrauterine IUD if you have one - For any acute change especially worsening headache or vision loss call 911 and proceed to ED    Meds ordered this encounter  Medications   acetaZOLAMIDE  ER (DIAMOX ) 500 MG capsule    Sig: Take 1 capsule (500 mg total) by mouth 3 (three) times daily.    Dispense:  270 capsule    Refill:  4      To prevent or relieve headaches, try the following: Cool Compress. Lie down and place a cool compress on your head.  Avoid headache triggers. If certain foods or odors seem to have triggered your migraines in the past, avoid  them. A headache diary might help you identify triggers.  Include physical activity in your daily routine. Try a daily walk or other moderate aerobic exercise.  Manage stress. Find healthy ways to cope with the stressors, such as delegating tasks on your to-do list.  Practice relaxation techniques. Try deep breathing, yoga, massage and visualization.  Eat regularly. Eating regularly scheduled meals and maintaining a healthy diet might help prevent headaches. Also, drink plenty of fluids.  Follow a regular sleep schedule. Sleep deprivation might contribute to headaches Consider biofeedback. With this mind-body technique, you learn to control certain bodily functions -- such as muscle tension, heart rate and blood pressure -- to prevent headaches or reduce headache pain.    Proceed to emergency room if you experience new or worsening symptoms or symptoms do not resolve, if you have new neurologic symptoms or if headache is severe, or for any concerning symptom.   Provided education and documentation from [employer] toolbox including articles on: pseudotumoer cerebri(IIH), chronic migraine medication overuse headache, chronic migraines, prevention of migraines and other headaches, behavioral and other nonpharmacologic treatments for headache.   Orders Placed This Encounter  Procedures   Acetazolamide , (Diamox ), S/P   Meds ordered this encounter  Medications   acetaZOLAMIDE  ER (DIAMOX ) 500 MG capsule    Sig: Take 1 capsule (500 mg  total) by mouth 3 (three) times daily.    Dispense:  270 capsule    Refill:  4    Cc: Diona Franklin, FNP,  Diona Franklin, FNP  Aldona Amel, MD  [facility] [address] [place], [place] [address]  Phone [phone] Fax [phone]  I spent over 30 minutes of face-to-face and non-face-to-face time with patient on the  1. IIH (idiopathic intracranial hypertension)      diagnosis.  This  included previsit chart review, lab review, study review, order entry, electronic health record documentation, patient education on the different diagnostic and therapeutic options, counseling and coordination of care, risks and benefits of management, compliance, or risk factor reduction

## 2023-11-25 NOTE — Patient Instructions (Signed)
  Aldona Amel, MD

## 2023-11-25 NOTE — Addendum Note (Signed)
 Addended by: Meggen Spaziani B on: 11/25/2023 06:19 PM   Modules accepted: Level of Service

## 2024-06-17 ENCOUNTER — Ambulatory Visit (INDEPENDENT_AMBULATORY_CARE_PROVIDER_SITE_OTHER): Payer: BC Managed Care – PPO | Admitting: Neurology

## 2024-06-17 ENCOUNTER — Other Ambulatory Visit: Payer: Self-pay | Admitting: Neurology

## 2024-06-17 VITALS — BP 136/83 | HR 72 | Ht 67.0 in | Wt 225.6 lb

## 2024-06-17 DIAGNOSIS — R5383 Other fatigue: Secondary | ICD-10-CM | POA: Diagnosis not present

## 2024-06-17 DIAGNOSIS — G4733 Obstructive sleep apnea (adult) (pediatric): Secondary | ICD-10-CM | POA: Diagnosis not present

## 2024-06-17 DIAGNOSIS — E66812 Obesity, class 2: Secondary | ICD-10-CM

## 2024-06-17 DIAGNOSIS — G932 Benign intracranial hypertension: Secondary | ICD-10-CM | POA: Diagnosis not present

## 2024-06-17 DIAGNOSIS — Z6835 Body mass index (BMI) 35.0-35.9, adult: Secondary | ICD-10-CM

## 2024-06-17 MED ORDER — ACETAZOLAMIDE ER 500 MG PO CP12: 500.0000 mg | ORAL_CAPSULE | Freq: Three times a day (TID) | ORAL | 4 refills | Status: AC

## 2024-06-17 MED ORDER — TIRZEPATIDE-WEIGHT MANAGEMENT 2.5 MG/0.5ML ~~LOC~~ SOLN: 2.5000 mg | SUBCUTANEOUS | 4 refills | Status: AC

## 2024-06-17 NOTE — Patient Instructions (Signed)
 Keep diamox  Prescribe zepbound   Send a message when want to increase zepbound  to next level  Tirzepatide  Injection (Weight Management) What is this medication? TIRZEPATIDE  (tir ZEP a tide) promotes weight loss. It may also be used to maintain weight loss.  It works by decreasing appetite. It can be used to treat sleep apnea. Changes to diet and exercise are often combined with this medication. This medicine may be used for other purposes; ask your health care provider or pharmacist if you have questions. COMMON BRAND NAME(S): Zepbound  What should I tell my care team before I take this medication? They need to know if you have any of these conditions: Diabetes Eye disease caused by diabetes Gallbladder disease Have or have had depression Have or have had pancreatitis Having surgery Kidney disease Personal or family history of MEN 2, a condition that causes endocrine gland tumors Personal or family history of thyroid cancer Stomach or intestine problems, such as problems digesting food Suicidal thoughts, plans, or attempt An unusual or allergic reaction to tirzepatide , other medications, foods, dyes, or preservatives Pregnant or trying to get pregnant Breastfeeding How should I use this medication? This medication is injected under the skin. You will be taught how to prepare and give it. Take it as directed on the prescription label. Keep taking it unless your care team tells you to stop. It is important that you put your used needles and syringes in a special sharps container. Do not put them in a trash can. If you do not have a sharps container, call your pharmacist or care team to get one. A special MedGuide will be given to you by the pharmacist with each prescription and refill. Be sure to read this information carefully each time. This medication comes with INSTRUCTIONS FOR USE. Ask your pharmacist for directions on how to use this medication. Read the information carefully. Talk to  your pharmacist or care team if you have questions. Talk to your care team about the use of this medication in children. Special care may be needed. Overdosage: If you think you have taken too much of this medicine contact a poison control center or emergency room at once. NOTE: This medicine is only for you. Do not share this medicine with others. What if I miss a dose? If you miss a dose, take it as soon as you can unless it is more than 4 days (96 hours) late. If it is more than 4 days late, skip the missed dose. Take the next dose at the normal time. Do not take 2 doses within 3 days (72 hours) of each other. What may interact with this medication? Certain medications for diabetes, such as insulin, glyburide, glipizide This medication may affect how other medications work. Talk with your care team about all of the medications you take. They may suggest changes to your treatment plan to lower the risk of side effects and to make sure your medications work as intended. This list may not describe all possible interactions. Give your health care provider a list of all the medicines, herbs, non-prescription drugs, or dietary supplements you use. Also tell them if you smoke, drink alcohol, or use illegal drugs. Some items may interact with your medicine. What should I watch for while using this medication? Visit your care team for regular checks on your progress. Tell your care team if your condition does not start to get better or if it gets worse. Tell your care team if you are taking medication to treat  diabetes, such as insulin or glipizide. This may increase your risk of low blood sugar. Know the symptoms of low blood sugar and how to treat it. Talk to your care team about your risk of cancer. You may be more at risk for certain types of cancer if you take this medication. Talk to your care team right away if you have a lump or swelling in your neck, hoarseness that does not go away, trouble  swallowing, shortness of breath, or trouble breathing. Make sure you stay hydrated while taking this medication. Drink water often. Eat fruits and veggies that have a high water content. Drink more water when it is hot or you are active. Talk to your care team right away if you have fever, infection, vomiting, diarrhea, or if you sweat a lot while taking this medication. The loss of too much body fluid may make it dangerous for you to take this medication. If you are going to need surgery or a procedure, tell your care team that you are taking this medication. Estrogen and progestin hormones that you take by mouth may not work as well while you are taking this medication. Switch to a non-oral contraceptive or add a barrier contraceptive for 4 weeks after starting this medication and after each dose increase. Talk to your care team about contraceptive options. They can help you find the option that works for you. Do not take this medication without first talking to your care team if you may be or could become pregnant. Your care team can help you find the option that works for you. Weight loss is not recommended during pregnancy. Talk to your care team if you are breastfeeding. When recommended, this medication may be taken. Its use during breastfeeding has not been well studied. Your care team may suggest other options. What side effects may I notice from receiving this medication? Side effects that you should report to your care team as soon as possible: Allergic reactions--skin rash, itching, hives, swelling of the face, lips, tongue, or throat Change in vision Dehydration--increased thirst, dry mouth, feeling faint or lightheaded, headache, dark yellow or Authement urine Fast or irregular heartbeat Gallbladder problems--severe stomach pain, nausea, vomiting, fever Kidney injury--decrease in the amount of urine, swelling of the ankles, hands, or feet Pancreatitis--severe stomach pain that spreads to your  back or gets worse after eating or when touched, fever, nausea, vomiting Thoughts of suicide or self-harm, worsening mood, feelings of depression Thyroid cancer--new mass or lump in the neck, pain or trouble swallowing, trouble breathing, hoarseness Side effects that usually do not require medical attention (report these to your care team if they continue or are bothersome): Constipation Diarrhea Loss of appetite Nausea Upset stomach This list may not describe all possible side effects. Call your doctor for medical advice about side effects. You may report side effects to FDA at 1-800-FDA-1088. Where should I keep my medication? Keep out of the reach of children and pets. Store in a refrigerator or at room temperature up to 30 degrees C (86 degrees F). Keep it in the original container. Protect from light. Refrigeration (preferred): Store in the refrigerator. Do not freeze. Get rid of any unused medication after the expiration date. Room temperature: This medication may be stored at room temperature for up to 21 days. If it is stored at room temperature, get rid of any unused medication after 21 days or after it expires, whichever is first. To get rid of medications that are no longer needed or  have expired: Take the medication to a medication take-back program. Check with your pharmacy or law enforcement to find a location. If you cannot return the medication, ask your pharmacist or care team how to get rid of this medication safely. NOTE: This sheet is a summary. It may not cover all possible information. If you have questions about this medicine, talk to your doctor, pharmacist, or health care provider.  2025 [employer] Standard (2023-10-29 00:00:00)

## 2024-06-17 NOTE — Progress Notes (Signed)
 Provider:  Dr Ines Requesting Provider: Arloa Chroman, PA-C Primary Care Provider:  Claudene Laneta Bathe, FNP  CC:  headache  06/17/2024: Doing well. Diamox  three times a day. I sent her to Dr. Octavia for a full evaluation and he said everything was fine, only dry eyes. She feels her toes and fingers get numb on the medication. No headaches. Vision is fine. has Sepsis (HCC); Seizure disorder (HCC); OSA (obstructive sleep apnea); Obesity (BMI 30-39.9); Abscess of right leg; IIH (idiopathic intracranial hypertension); and Chronic migraine without aura without status migrainosus, not intractable on their problem list. Uses a cpap machine for sleep apnea. She has obesity and sleep apnea, recently zepbound  approved for moderate to severe sleep apnea, she ahs ; note from 03/01/2020 she has SEVERE sleep apnea will start zepbound . Never had pancreatitis or gastroparesis or any thyroid disorder and BMI > 35  No other focal neurologic deficits, associated symptoms, inciting events or modifiable factors.Patient complains of symptoms per HPI as well as the following symptoms: none . Pertinent negatives and positives per HPI. All others negative    11/25/2023: LOVELY patient who usually comes with her wife. We see her for IDIOPATHIC INTRACRANIAL HYPERTENSION and she is doing well/ stale.  Was recently seen in the ED and dxed with orbital cellulitis right eye given antibiotics (IV in ED and oral upon discharge) and followed up outpatient 09/01/2023. On 05/2023 diamox  was increased 1000mg  bid and she is currently doing well on 500mg  tid instead so we can keep it that way. We referred her to the [facility] in 01/2023, does not appear as though she was able to go. No blurry vision. She will get a migraine headache 1-2x a month, she was supposed to send us  FMLA we spoke about it 05/22/2023 and they will get it to use we can backdate to 12/2022.She is taking acetazolamide  3x a day. We put her on aimovig   05/2023 for her migraines hasn't needed to take it. Feeling well, she isn't sure how may migraine days a month she has had. As long as she takes her medication she is fine.  Get us  FMLA and we back date it to 12/2022.  Patient complains of symptoms per HPI as well as the following symptoms: none . Pertinent negatives and positives per HPI. All others negative   05/22/2023: Saw the eye doctor, everything was fine. Last time I saw her she had no headaches. She has been out of it for 4 days because she was taking extra every day. She is also having migrainous headaches. Pulsating/pounding/throbbing, light and sound sensitivity, nausea. No constipation. She is having at least 8 migraines a day and > 15 headache days a month.  She will drop off fmla. Discussed options.  Patient complains of symptoms per HPI as well as the following symptoms: migraines . Pertinent negatives and positives per HPI. All others negative   Meds taken > 3 months for migraines include: Taken acetazolamide , topamax, amitriptyline,  meloxicam , methocarbamol , Compazine , Tylenol , acetazolamide , Benadryl , Advil , ketorolac  injections, Goody powders, Robaxin , meloxicam , Benadryl , Reglan , Zofran  and Compazine . amitriptyline, metformin, BP medications contraindicated due to hypotension,   01/15/2023: On diamox , she is feeling better.  CSF with an opening pressure of 22 cm water, measured in the left lateral decubitus position. 17 ml of CSF were obtained for laboratory studies. Closing pressure was 15 cm water. Started on the diamox , taking 3x a day. She is having tingling in the fingers and toes and thins taste flat but  otherwise doing well. Lost 4 pounds.Her insurance would not pay for wegovy . They couldn't make it to the headache wellness center they need to reschedule. They have a follow up with Dr. Octavia and planning to get new glasses. By the time she saw Dr. Octavia was already on acetazolamide  and the optic nerves were normal. There is high  water mark e.g. would suggest recent swelling). No spontaneous venous pulsations only exam finding suggestive of ICP. And patient is feeling better, headaches are completely gone, if she takes the medicine the headache comes back. Also had she not been on treatment, 22 might have ben higher. Saw Dr. Octavia last 12/13/22 and has a follow up 5-6 weeks (next is this month sometime) then per his notes will follow every 6 months. He could not get VA better than 20/60OD and 20/50 OS. Will treat will phentermine  until she can see [facility]. Dr. Octavia in ophtho could not get VA beter than 20/50 and 20/60 so will wait and try again and gave her eye drops. Reviewed HVF/OCT. 11/28/2022 CTV was normal, reviewed images and agree, IMPRESSION: Normal CT venogram. No evidence of dural venous sinus thrombosis. Wegovy  not paid for by her insurabce but her hgba1c is abnormal will try to order ozempic or mounjaro .  Patient complains of symptoms per HPI as well as the following symptoms: tingling . Pertinent negatives and positives per HPI. All others negative   HPI:  Krista Crosby is a 48 y.o. female here as requested by Arloa Chroman, PA-C for hospital follow up for headache and questioned IDIOPATHIC INTRACRANIAL HYPERTENSION.  She has a past medical history of obstructive sleep apnea, seizure disorder, obesity, abscess of right leg, sepsis.  I reviewed emergency room notes from 09/30/2023 where she presented with a severe headache.  She complained of persistent frequent headaches for the past few months, taking BC powder every day to try to help with the headaches, sometimes multiple times a day, worse with bright lights and loud noises, no fever numbness or weakness to the arms or legs.  A migraine cocktail helped.  We also considered rebound headaches and talk to her about it.  CT head was negative for acute process.  She was seen back in the emergency room November 28, 2022 with repeat CT head and  CTV which were again negative for anything acute.  Patient reported headaches again, blurry vision, global severe headaches, light sensitivity, ongoing 3 months daily, no other focal neurologic deficits or vision changes.  Physical and neurologic exam are normal.  LP was attempted but unsuccessful.  Ultrasound in ED ocular showed that the right eye showed increased optic nerve sheath diameter 6 mm as did the left at 7 mm.  Given her body habitus and age IIH was a possibility.  Again migraine cocktail with significant improvement.  They discharged her on acetazolamide  250 mg twice daily.   Migraine cocktails help. She has had headaches 4 months in the setting of some mild weight gain, worsening, band around her head, opening pressure. Opening pressure was only 22 however was on diamox  and feels significantly improved after LP with fluid removal. Immediate relief. Today she looks tremendously beter. Still having headache but she can open eyes, vision still blurry but so much better. Opening pressure was only 22 however was on diamox  and feels significantly improved after LP with fluid removal. Still some blurry vision, still some pressure headache but yesterday 10/10 mayne 3/10, discussed IDIOPATHIC INTRACRANIAL HYPERTENSION, light still bother  her not like before. Uses cpap every night. No other focal neurologic deficits, associated symptoms, inciting events or modifiable factors. No other focal neurologic deficits, associated symptoms, inciting events or modifiable factors. No hx of migraines.   Reviewed notes, labs and imaging from outside physicians, which showed:   CT head 11/28/2022: CLINICAL DATA:  New onset headache.   EXAM: CT HEAD WITHOUT CONTRAST   TECHNIQUE: Contiguous axial images were obtained from the base of the skull through the vertex without intravenous contrast.   RADIATION DOSE REDUCTION: This exam was performed according to the departmental dose-optimization program which includes  automated exposure control, adjustment of the mA and/or kV according to patient size and/or use of iterative reconstruction technique.   COMPARISON:  09/28/2022   FINDINGS: Brain: There is no evidence for acute hemorrhage, hydrocephalus, mass lesion, or abnormal extra-axial fluid collection. No definite CT evidence for acute infarction.   Vascular: No hyperdense vessel or unexpected calcification.   Skull: No evidence for fracture. No worrisome lytic or sclerotic lesion.   Sinuses/Orbits: The visualized paranasal sinuses and mastoid air cells are clear. Visualized portions of the globes and intraorbital fat are unremarkable.   Other: None.   IMPRESSION: No acute intracranial abnormality.  CTV: 11/28/2022:  FINDINGS: Superior sagittal sinus is patent and normal. Both transverse sinuses are patent with the right being dominant. Flow is visible in both sigmoid sinuses and internal jugular veins. Deep veins appear normal. No finding to suggest superficial thrombosis.   IMPRESSION: Normal CT venogram. No evidence of dural venous sinus thrombosis.      Social History   Socioeconomic History   Marital status: Married    Spouse name: Not on file   Number of children: Not on file   Years of education: Not on file   Highest education level: Not on file  Occupational History   Not on file  Tobacco Use   Smoking status: Former   Smokeless tobacco: Never  Vaping Use   Vaping status: Never Used  Substance and Sexual Activity   Alcohol use: Yes    Comment: socially   Drug use: No   Sexual activity: Yes    Birth control/protection: None  Other Topics Concern   Not on file  Social History Narrative   Caffeine: avg 3 cups per day   Social Drivers of Health   Financial Resource Strain: Not on file  Food Insecurity: Not on file  Transportation Needs: Not on file  Physical Activity: Not on file  Stress: Not on file  Social Connections: Not on file  Intimate Partner  Violence: Not on file    Family History  Problem Relation Age of Onset   Migraines Neg Hx    Headache Neg Hx     Past Medical History:  Diagnosis Date   Seizures (HCC)     Patient Active Problem List   Diagnosis Date Noted   Chronic migraine without aura without status migrainosus, not intractable 05/22/2023   IIH (idiopathic intracranial hypertension) 12/04/2022   Sepsis (HCC) 07/21/2020   Seizure disorder (HCC) 07/21/2020   OSA (obstructive sleep apnea) 07/21/2020   Obesity (BMI 30-39.9) 07/21/2020   Abscess of right leg 07/21/2020    Past Surgical History:  Procedure Laterality Date   FOOT SURGERY     LUMBAR PUNCTURE  12/03/2022    Current Outpatient Medications  Medication Sig Dispense Refill   diclofenac  Sodium (VOLTAREN ) 1 % GEL Apply 4 g topically 4 (four) times daily. 50 g 0  methocarbamol  (ROBAXIN ) 500 MG tablet Take 1 tablet (500 mg total) by mouth 2 (two) times daily. 20 tablet 0   mupirocin  nasal ointment (BACTROBAN ) 2 % Apply in each nostril daily 1 g 0   omeprazole  (PRILOSEC  OTC) 20 MG tablet Take 1 tablet (20 mg total) by mouth daily. 10 tablet    ondansetron  (ZOFRAN -ODT) 4 MG disintegrating tablet Take 1-2 tablets (4-8 mg total) by mouth every 8 (eight) hours as needed. 30 tablet 3   rizatriptan  (MAXALT -MLT) 10 MG disintegrating tablet Take 1 tablet (10 mg total) by mouth as needed for migraine. May repeat in 2 hours if needed 9 tablet 11   tirzepatide  (ZEPBOUND ) 2.5 MG/0.5ML injection vial Inject 2.5 mg into the skin once a week. 2 mL 4   acetaZOLAMIDE  ER (DIAMOX ) 500 MG capsule Take 1 capsule (500 mg total) by mouth 3 (three) times daily. 270 capsule 4   erythromycin  ophthalmic ointment Place 1 Application into the left eye at bedtime. (Patient not taking: Reported on 06/17/2024) 3.5 g 0   meloxicam  (MOBIC ) 15 MG tablet Take 1 tablet (15 mg total) by mouth daily. (Patient not taking: Reported on 06/17/2024) 10 tablet 0   No current facility-administered  medications for this visit.    Allergies as of 06/17/2024   (No Known Allergies)    Vitals: BP 136/83 (Cuff Size: Large)   Pulse 72   Ht 5' 7 (1.702 m)   Wt 225 lb 9.6 oz (102.3 kg)   BMI 35.33 kg/m  Last Weight:  Wt Readings from Last 1 Encounters:  06/17/24 225 lb 9.6 oz (102.3 kg)   Last Height:   Ht Readings from Last 1 Encounters:  06/17/24 5' 7 (1.702 m)    Physical exam: Exam: Gen: NAD, conversant      CV: No palpitations or chest pain or SOB. VS: Breathing at a normal rate. obese. Not febrile. Eyes: Conjunctivae clear without exudates or hemorrhage  Neuro: Detailed Neurologic Exam  Speech:    Speech is normal; fluent and spontaneous with normal comprehension.  Cognition:    The patient is oriented to person, place, and time;     recent and remote memory intact;     language fluent;     normal attention, concentration, fund of knowledge Cranial Nerves:    The pupils are equal, round, and reactive to light. Visual fields are full Extraocular movements are intact.  The face is symmetric with normal sensation. The palate elevates in the midline. Hearing intact. Voice is normal. Shoulder shrug is normal. The tongue has normal motion without fasciculations.   Coordination: normal  Gait:    No abnormalities noted or reported  Motor Observation:   no involuntary movements noted. Tone:    Appears normal  Posture:    Posture is normal. normal erect    Strength:    Strength is anti-gravity and symmetric in the upper and lower limbs.      Sensation: intact to LT, no reports of numbness or tingling or paresthesias        Assessment/Plan:  48 y.o. female here as requested by Arloa Chroman, PA-C for IIH  She has a past medical history of obstructive sleep apnea, seizure disorder, morbid obesity, abscess of right leg, sepsis.  I reviewed emergency room notes from 09/28/2022 where she presented with a severe headache.  She complained of persistent frequent  headaches for the past few months, taking BC powder every day to try to help with the headaches, sometimes multiple times  a day, worse with bright lights and loud noises, no fever numbness or weakness to the arms or legs.  A migraine cocktail helped.  They also considered rebound headaches.  CT head was negative for acute process.  She was seen back in the emergency room November 28, 2022 with repeat CT head and CTV which were again negative for anything acute.  Patient reported headaches again, blurry vision, global severe headaches, light sensitivity, ongoing 3 months daily, no other focal neurologic deficits, reported vision changes.  Physical and neurologic exam were normal.  LP was attempted but unsuccessful so they just started her on diamox .  Ultrasound in ED ocular showed that the right eye showed increased optic nerve sheath diameter 6 mm as did the left at 7 mm.  Given her body habitus and age IIH was a possibility.   They discharged her on acetazolamide  250 mg twice daily.  significantly improved after LP with fluid removal. LP OP was only 22 but she was already on acetazolamide .  - doing well on 500mg  diamox  tid, no headaches, few migraines - Severe OSA, BMI > 35, will try and get zepbound  approved - recommended [facility] 01/2023, she has not been able to go, recommended weight loss, will try and get zepbound  approved - She will get a migraine headache less than 1-2x a month,happy to fill out FMLA if needed - We prescribed her on aimovig  05/2023 for her migraines hasn't needed to take it.  - states rizatriptan  helps acutely, next we  we can try Ubrelvy or nurtec - Nausea: ondansetron  - By the time she saw Dr. Octavia 12/13/2022 in ophthalmology, she was already on acetazolamide  and the optic nerves were normal BUT Per Dr. Glendia Octavia There is high water mark e.g. would suggest recent swelling.  No spontaneous venous pulsations only exam finding suggestive of ICP.  Follow up yearly  as needed - 12/03/2022: CTV was normal - Weight loss is critical, discussed weight loss and risk of permanent vision loss; starting zepbound  - Remove and intrauterine IUD if you have one - For any acute change especially worsening headache or vision loss call 911 and proceed to ED    Meds ordered this encounter  Medications   acetaZOLAMIDE  ER (DIAMOX ) 500 MG capsule    Sig: Take 1 capsule (500 mg total) by mouth 3 (three) times daily.    Dispense:  270 capsule    Refill:  4   tirzepatide  (ZEPBOUND ) 2.5 MG/0.5ML injection vial    Sig: Inject 2.5 mg into the skin once a week.    Dispense:  2 mL    Refill:  4    Orders Placed This Encounter  Procedures   CBC with Differential/Platelets   Comprehensive metabolic panel with GFR   TSH Rfx on Abnormal to Free T4   Lipid Panel   Vitamin D , 25-hydroxy     To prevent or relieve headaches, try the following: Cool Compress. Lie down and place a cool compress on your head.  Avoid headache triggers. If certain foods or odors seem to have triggered your migraines in the past, avoid them. A headache diary might help you identify triggers.  Include physical activity in your daily routine. Try a daily walk or other moderate aerobic exercise.  Manage stress. Find healthy ways to cope with the stressors, such as delegating tasks on your to-do list.  Practice relaxation techniques. Try deep breathing, yoga, massage and visualization.  Eat regularly. Eating regularly scheduled meals and maintaining  a healthy diet might help prevent headaches. Also, drink plenty of fluids.  Follow a regular sleep schedule. Sleep deprivation might contribute to headaches Consider biofeedback. With this mind-body technique, you learn to control certain bodily functions -- such as muscle tension, heart rate and blood pressure -- to prevent headaches or reduce headache pain.    Proceed to emergency room if you experience new or worsening symptoms or symptoms do not  resolve, if you have new neurologic symptoms or if headache is severe, or for any concerning symptom.   Provided education and documentation from American headache [employer] toolbox including articles on: pseudotumoer cerebri(IIH), chronic migraine medication overuse headache, chronic migraines, prevention of migraines and other headaches, behavioral and other nonpharmacologic treatments for headache.   Orders Placed This Encounter  Procedures   CBC with Differential/Platelets   Comprehensive metabolic panel with GFR   TSH Rfx on Abnormal to Free T4   Lipid Panel   Vitamin D , 25-hydroxy   Meds ordered this encounter  Medications   acetaZOLAMIDE  ER (DIAMOX ) 500 MG capsule    Sig: Take 1 capsule (500 mg total) by mouth 3 (three) times daily.    Dispense:  270 capsule    Refill:  4   tirzepatide  (ZEPBOUND ) 2.5 MG/0.5ML injection vial    Sig: Inject 2.5 mg into the skin once a week.    Dispense:  2 mL    Refill:  4    Cc: Claudene Laneta Bathe, FNP,  Claudene Laneta Bathe, FNP  Onetha Epp, MD  [facility] [address] [place], [place] [address]  Phone [phone] Fax [phone]  I spent 31 minutes of face-to-face and non-face-to-face time with patient on the  1. OSA (obstructive sleep apnea)   2. IIH (idiopathic intracranial hypertension)   3. Class 2 obesity with alveolar hypoventilation, serious comorbidity, and body mass index (BMI) of 35.0 to 35.9 in adult (HCC)   4. Other fatigue    diagnosis.  This included previsit chart review, lab review, study review, order entry, electronic health record documentation, patient education on the different diagnostic and therapeutic options, counseling and coordination of care, risks and benefits of management, compliance, or risk factor reduction   I spent over 30 minutes of face-to-face and non-face-to-face time with patient on the  1. OSA (obstructive sleep apnea)   2. IIH (idiopathic intracranial  hypertension)   3. Class 2 obesity with alveolar hypoventilation, serious comorbidity, and body mass index (BMI) of 35.0 to 35.9 in adult (HCC)   4. Other fatigue       diagnosis.  This included previsit chart review, lab review, study review, order entry, electronic health record documentation, patient education on the different diagnostic and therapeutic options, counseling and coordination of care, risks and benefits of management, compliance, or risk factor reduction

## 2024-06-18 ENCOUNTER — Encounter: Payer: Self-pay | Admitting: Neurology

## 2024-06-18 ENCOUNTER — Ambulatory Visit: Payer: Self-pay | Admitting: Neurology

## 2024-06-18 ENCOUNTER — Other Ambulatory Visit (HOSPITAL_COMMUNITY): Payer: Self-pay

## 2024-06-18 ENCOUNTER — Telehealth: Payer: Self-pay

## 2024-06-18 LAB — COMPREHENSIVE METABOLIC PANEL WITH GFR
ALT: 15 IU/L (ref 0–32)
AST: 14 IU/L (ref 0–40)
Albumin: 4.1 g/dL (ref 3.9–4.9)
Alkaline Phosphatase: 60 IU/L (ref 44–121)
BUN/Creatinine Ratio: 17 (ref 9–23)
BUN: 14 mg/dL (ref 6–24)
Bilirubin Total: <0.2 mg/dL (ref 0.0–1.2)
CO2: 18 mmol/L — ABNORMAL LOW (ref 20–29)
Calcium: 9.1 mg/dL (ref 8.7–10.2)
Chloride: 108 mmol/L — ABNORMAL HIGH (ref 96–106)
Creatinine, Ser: 0.83 mg/dL (ref 0.57–1.00)
Globulin, Total: 2.8 g/dL (ref 1.5–4.5)
Glucose: 90 mg/dL (ref 70–99)
Potassium: 4.6 mmol/L (ref 3.5–5.2)
Sodium: 138 mmol/L (ref 134–144)
Total Protein: 6.9 g/dL (ref 6.0–8.5)
eGFR: 87 mL/min/1.73 (ref >59)

## 2024-06-18 LAB — LIPID PANEL
Chol/HDL Ratio: 3.6 ratio (ref 0.0–4.4)
Cholesterol, Total: 166 mg/dL (ref 100–199)
HDL: 46 mg/dL (ref >39)
LDL Chol Calc (NIH): 104 mg/dL — ABNORMAL HIGH (ref 0–99)
Triglycerides: 86 mg/dL (ref 0–149)
VLDL Cholesterol Cal: 16 mg/dL (ref 5–40)

## 2024-06-18 LAB — CBC WITH DIFFERENTIAL/PLATELET
Basophils Absolute: 0 x10E3/uL (ref 0.0–0.2)
Basos: 1 %
EOS (ABSOLUTE): 0.1 x10E3/uL (ref 0.0–0.4)
Eos: 1 %
Hematocrit: 45 % (ref 34.0–46.6)
Hemoglobin: 13.5 g/dL (ref 11.1–15.9)
Immature Grans (Abs): 0 x10E3/uL (ref 0.0–0.1)
Immature Granulocytes: 0 %
Lymphocytes Absolute: 2.4 x10E3/uL (ref 0.7–3.1)
Lymphs: 43 %
MCH: 27.3 pg (ref 26.6–33.0)
MCHC: 30 g/dL — ABNORMAL LOW (ref 31.5–35.7)
MCV: 91 fL (ref 79–97)
Monocytes Absolute: 0.6 x10E3/uL (ref 0.1–0.9)
Monocytes: 11 %
Neutrophils Absolute: 2.5 x10E3/uL (ref 1.4–7.0)
Neutrophils: 44 %
Platelets: 242 x10E3/uL (ref 150–450)
RBC: 4.95 x10E6/uL (ref 3.77–5.28)
RDW: 12.5 % (ref 11.7–15.4)
WBC: 5.6 x10E3/uL (ref 3.4–10.8)

## 2024-06-18 LAB — VITAMIN D 25 HYDROXY (VIT D DEFICIENCY, FRACTURES): Vit D, 25-Hydroxy: 12.2 ng/mL — ABNORMAL LOW (ref 30.0–100.0)

## 2024-06-18 LAB — TSH RFX ON ABNORMAL TO FREE T4: TSH: 1.24 u[IU]/mL (ref 0.450–4.500)

## 2024-06-18 NOTE — Telephone Encounter (Signed)
   Plan will not cover this med as it is an exclusion.

## 2024-06-18 NOTE — Telephone Encounter (Signed)
 Zepbound  excluded from pts insurance plan per PA team.

## 2024-07-23 ENCOUNTER — Telehealth: Payer: Self-pay

## 2024-07-23 NOTE — Telephone Encounter (Signed)
 Called and left voicemail for patient to reschedule appointment on 12/23/2024 with Dr Ines.  If patient calls back, they can be rescheduled with Dr Margaret

## 2024-12-23 ENCOUNTER — Ambulatory Visit: Admitting: Neurology
